# Patient Record
Sex: Female | Born: 1990
Health system: Southern US, Community
[De-identification: ages and names within clinical notes are randomized; demographics above are authoritative.]

## PROBLEM LIST (undated history)

## (undated) ENCOUNTER — Inpatient Hospital Stay (HOSPITAL_COMMUNITY): Payer: Self-pay

## (undated) DIAGNOSIS — R011 Cardiac murmur, unspecified: Secondary | ICD-10-CM

## (undated) HISTORY — DX: Cardiac murmur, unspecified: R01.1

---
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
## 2009-02-20 ENCOUNTER — Emergency Department (HOSPITAL_COMMUNITY): Admission: EM | Admit: 2009-02-20 | Discharge: 2009-02-20 | Payer: Self-pay | Admitting: Emergency Medicine

## 2010-01-02 ENCOUNTER — Other Ambulatory Visit
Admission: RE | Admit: 2010-01-02 | Discharge: 2010-01-02 | Payer: Self-pay | Source: Home / Self Care | Admitting: Obstetrics and Gynecology

## 2010-04-01 ENCOUNTER — Emergency Department (HOSPITAL_COMMUNITY)
Admission: EM | Admit: 2010-04-01 | Discharge: 2010-04-01 | Disposition: A | Discharge status: Home / Self Care | Payer: BC Managed Care – PPO | Attending: Emergency Medicine | Admitting: Emergency Medicine

## 2010-04-01 DIAGNOSIS — J209 Acute bronchitis, unspecified: Secondary | ICD-10-CM | POA: Insufficient documentation

## 2010-04-01 DIAGNOSIS — R0602 Shortness of breath: Secondary | ICD-10-CM | POA: Insufficient documentation

## 2010-04-04 LAB — PREGNANCY, URINE: Preg Test, Ur: NEGATIVE

## 2013-01-13 NOTE — L&D Delivery Note (Signed)
Delivery Note Patient pushed for 45 minutes and at 10:53 AM a viable and healthy female was delivered via Vaginal, Spontaneous Delivery (Presentation: Left Occiput Anterior).  APGAR: 9, 9; weight .  Placenta status: Intact, Spontaneous.  Cord: 3 vessels with the following complications: None.   Anesthesia: Epidural  Episiotomy: none Lacerations: 2nd degree; left Sulcus Suture Repair: 2.0 3.0 chromic vicryl Est. Blood Loss (mL): 450  Mom to postpartum.  Baby to Couplet care / Skin to Skin.  Essie HartINN, Nevaan Bunton [name] 08/27/2013, 12:02 PM

## 2013-03-04 LAB — OB RESULTS CONSOLE ABO/RH: RH Type: POSITIVE

## 2013-03-04 LAB — OB RESULTS CONSOLE HIV ANTIBODY (ROUTINE TESTING): HIV: NONREACTIVE

## 2013-03-04 LAB — OB RESULTS CONSOLE HEPATITIS B SURFACE ANTIGEN: Hepatitis B Surface Ag: NEGATIVE

## 2013-03-04 LAB — OB RESULTS CONSOLE GC/CHLAMYDIA
CHLAMYDIA, DNA PROBE: NEGATIVE
GC PROBE AMP, GENITAL: NEGATIVE

## 2013-03-04 LAB — OB RESULTS CONSOLE ANTIBODY SCREEN: Antibody Screen: NEGATIVE

## 2013-03-04 LAB — OB RESULTS CONSOLE RUBELLA ANTIBODY, IGM: Rubella: NON-IMMUNE/NOT IMMUNE

## 2013-03-04 LAB — OB RESULTS CONSOLE RPR: RPR: NONREACTIVE

## 2013-04-26 ENCOUNTER — Inpatient Hospital Stay (HOSPITAL_COMMUNITY): Payer: BC Managed Care – PPO

## 2013-04-26 ENCOUNTER — Inpatient Hospital Stay (HOSPITAL_COMMUNITY)
Admission: AD | Admit: 2013-04-26 | Discharge: 2013-04-26 | Disposition: A | Discharge status: Home / Self Care | Payer: BC Managed Care – PPO | Source: Ambulatory Visit | Attending: Obstetrics and Gynecology | Admitting: Obstetrics and Gynecology

## 2013-04-26 ENCOUNTER — Encounter (HOSPITAL_COMMUNITY): Payer: Self-pay

## 2013-04-26 DIAGNOSIS — O4690 Antepartum hemorrhage, unspecified, unspecified trimester: Secondary | ICD-10-CM

## 2013-04-26 DIAGNOSIS — O209 Hemorrhage in early pregnancy, unspecified: Secondary | ICD-10-CM | POA: Insufficient documentation

## 2013-04-26 DIAGNOSIS — R109 Unspecified abdominal pain: Secondary | ICD-10-CM | POA: Insufficient documentation

## 2013-04-26 LAB — URINALYSIS, ROUTINE W REFLEX MICROSCOPIC
BILIRUBIN URINE: NEGATIVE
GLUCOSE, UA: NEGATIVE mg/dL
HGB URINE DIPSTICK: NEGATIVE
KETONES UR: NEGATIVE mg/dL
NITRITE: NEGATIVE
PROTEIN: NEGATIVE mg/dL
Specific Gravity, Urine: 1.02 (ref 1.005–1.030)
UROBILINOGEN UA: 0.2 mg/dL (ref 0.0–1.0)
pH: 7 (ref 5.0–8.0)

## 2013-04-26 LAB — WET PREP, GENITAL
Trich, Wet Prep: NONE SEEN
Yeast Wet Prep HPF POC: NONE SEEN

## 2013-04-26 LAB — URINE MICROSCOPIC-ADD ON

## 2013-04-26 LAB — ABO/RH: ABO/RH(D): A POS

## 2013-04-26 NOTE — MAU Provider Note (Signed)
History     CSN: [id number]  Arrival date and time: 04/26/13 1404   None     Chief Complaint  Patient presents with  . Vaginal Bleeding  . Abdominal Cramping   HPI  Ms. [name] is a 23 y.o. female G1P0 at 6039w6d who presents with vaginal bleeding that started yesterday, along with abdominal cramping. No recent intercourse. Yesterday the bleeding was red, today it is more pink. She rates the abdominal cramping 3-4. Denies bleeding any other time in this pregnancy. No history of placenta previa that she knows of.   OB History   Grav Para Term Preterm Abortions TAB SAB Ect Mult Living   1               History reviewed. No pertinent past medical history.  Past Surgical History  Procedure Laterality Date  . Wisdom tooth extraction  2009    History reviewed. No pertinent family history.  History  Substance Use Topics  . Smoking status: Never Smoker   . Smokeless tobacco: Current User  . Alcohol Use: Yes     Comment: occasional in the past. Not with the pregnancy    Allergies: No Known Allergies  Prescriptions prior to admission  Medication Sig Dispense Refill  . Prenatal Vit-Fe Fumarate-FA (PRENATAL MULTIVITAMIN) TABS tablet Take 1 tablet by mouth at bedtime.      . simethicone (MYLICON) 125 MG chewable tablet Chew 125 mg by mouth every 6 (six) hours as needed for flatulence.       Results for orders placed during the hospital encounter of 04/26/13 (from the past 48 hour(s))  URINALYSIS, ROUTINE W REFLEX MICROSCOPIC     Status: Abnormal   Collection Time    04/26/13  2:25 PM      Result Value Ref Range   Color, Urine YELLOW  YELLOW   APPearance HAZY (*) CLEAR   Specific Gravity, Urine 1.020  1.005 - 1.030   pH 7.0  5.0 - 8.0   Glucose, UA NEGATIVE  NEGATIVE mg/dL   Hgb urine dipstick NEGATIVE  NEGATIVE   Bilirubin Urine NEGATIVE  NEGATIVE   Ketones, ur NEGATIVE  NEGATIVE mg/dL   Protein, ur NEGATIVE  NEGATIVE mg/dL   Urobilinogen, UA 0.2  0.0 - 1.0  mg/dL   Nitrite NEGATIVE  NEGATIVE   Leukocytes, UA SMALL (*) NEGATIVE  URINE MICROSCOPIC-ADD ON     Status: Abnormal   Collection Time    04/26/13  2:25 PM      Result Value Ref Range   Squamous Epithelial / LPF MANY (*) RARE   WBC, UA 3-6  <3 WBC/hpf   RBC / HPF 0-2  <3 RBC/hpf   Bacteria, UA FEW (*) RARE   Urine-Other AMORPHOUS URATES/PHOSPHATES    ABO/RH     Status: None   Collection Time    04/26/13  3:00 PM      Result Value Ref Range   ABO/RH(D) A POS    WET PREP, GENITAL     Status: Abnormal   Collection Time    04/26/13  3:18 PM      Result Value Ref Range   Yeast Wet Prep HPF POC NONE SEEN  NONE SEEN   Trich, Wet Prep NONE SEEN  NONE SEEN   Clue Cells Wet Prep HPF POC FEW (*) NONE SEEN   WBC, Wet Prep HPF POC FEW (*) NONE SEEN   Comment: MODERATE BACTERIA SEEN    Review of Systems  Constitutional: Negative for  fever and chills.  Gastrointestinal: Positive for abdominal pain. Negative for nausea, vomiting, diarrhea and constipation.  Genitourinary: Negative for dysuria, urgency, frequency and hematuria.       No vaginal discharge. + vaginal bleeding. No dysuria.    Physical Exam   Blood pressure 118/71, pulse 88, temperature 98.8 F (37.1 C), temperature source Oral, resp. rate 16, height 5\' 5"  (1.651 m), weight 58.151 kg (128 lb 3.2 oz), SpO2 100.00%.  Physical Exam  Constitutional: She is oriented to person, place, and time. She appears well-developed and well-nourished. No distress.  HENT:  Head: Normocephalic.  Eyes: Pupils are equal, round, and reactive to light.  Neck: Neck supple.  Respiratory: Effort normal.  GI: Soft. She exhibits no distension. There is no tenderness.  Genitourinary: Vaginal discharge found.  Speculum exam: Vagina - Small amount of creamy, bubbly, brown discharge, no odor Cervix - No contact bleeding, no active bleeding  Bimanual exam: Cervix closed Uterus non tender Adnexa non tender, no masses bilaterally Wet prep  done Chaperone present for exam.   Musculoskeletal: Normal range of motion.  Neurological: She is alert and oriented to person, place, and time.  Skin: Skin is warm. She is not diaphoretic.  Psychiatric: Her behavior is normal.    MAU Course  Procedures None  MDM +fht 134 bpm  Consulted with Dr. Dareen PianoAnderson; discussed US findings with him. Ok to discharge patient home  Report shows AFI within normal limits, Placenta anterior, above cervical os, cervical length 3.81 cm, no evidence of previa.  A positive blood type Patient has good fetal movement at the time of discharge.   Assessment and Plan   A:  Vaginal bleeding in second trimester; unknown etiology   P:  Discharge home in stable condition  Pelvic rest Bleeding precautions discussed Return to MAU if symptoms worsen Keep your appointment in the office on Friday.  Iona HansenJennifer Irene Yasaman Kolek, NP  04/26/2013, 5:32 PM

## 2013-04-26 NOTE — Discharge Instructions (Signed)
Vaginal Bleeding During Pregnancy, Second Trimester A small amount of bleeding (spotting) from the vagina is relatively common in pregnancy. It usually stops on its own. Various things can cause bleeding or spotting in pregnancy. Some bleeding may be related to the pregnancy, and some may not. Sometimes the bleeding is normal and is not a problem. However, bleeding can also be a sign of something serious. Be sure to tell your health care provider about any vaginal bleeding right away. Some possible causes of vaginal bleeding during the second trimester include:  Infection, inflammation, or growths on the cervix.   The placenta may be partially or completely covering the opening of the cervix inside the uterus (placenta previa).  The placenta may have separated from the uterus (abruption of the placenta).   You may be having early (preterm) labor.   The cervix may not be strong enough to keep a baby inside the uterus (cervical insufficiency).   Tiny cysts may have developed in the uterus instead of pregnancy tissue (molar pregnancy). HOME CARE INSTRUCTIONS  Watch your condition for any changes. The following actions may help to lessen any discomfort you are feeling:  Follow your health care provider's instructions for limiting your activity. If your health care provider orders bed rest, you may need to stay in bed and only get up to use the bathroom. However, your health care provider may allow you to continue light activity.  If needed, make plans for someone to help with your regular activities and responsibilities while you are on bed rest.  Keep track of the number of pads you use each day, how often you change pads, and how soaked (saturated) they are. Write this down.  Do not use tampons. Do not douche.  Do not have sexual intercourse or orgasms until approved by your health care provider.  If you pass any tissue from your vagina, save the tissue so you can show it to your  health care provider.  Only take over-the-counter or prescription medicines as directed by your health care provider.  Do not take aspirin because it can make you bleed.  Do not exercise or perform any strenuous activities or heavy lifting without your health care provider's permission.  Keep all follow-up appointments as directed by your health care provider. SEEK MEDICAL CARE IF:  You have any vaginal bleeding during any part of your pregnancy.  You have cramps or labor pains. SEEK IMMEDIATE MEDICAL CARE IF:   You have severe cramps in your back or belly (abdomen).  You have contractions.  You have a fever, not controlled by medicine.  You have chills.  You pass large clots or tissue from your vagina.  Your bleeding increases.  You feel lightheaded or weak, or you have fainting episodes.  You are leaking fluid or have a gush of fluid from your vagina. MAKE SURE YOU:  Understand these instructions.  Will watch your condition.  Will get help right away if you are not doing well or get worse. Document Released: 10/09/2004 Document Revised: 10/20/2012 Document Reviewed: 09/06/2012 [facility] Patient Information 2014 [place].  Pelvic Rest Pelvic rest is sometimes recommended for women when:   The placenta is partially or completely covering the opening of the cervix (placenta previa).  There is bleeding between the uterine wall and the amniotic sac in the first trimester (subchorionic hemorrhage).  The cervix begins to open without labor starting (incompetent cervix, cervical insufficiency).  The labor is too early (preterm labor). HOME CARE INSTRUCTIONS  Do not have sexual intercourse, stimulation, or an orgasm.  Do not use tampons, douche, or put anything in the vagina.  Do not lift anything over 10 pounds (4.5 kg).  Avoid strenuous activity or straining your pelvic muscles. SEEK MEDICAL CARE IF:  You have any vaginal bleeding during pregnancy.  Treat this as a potential emergency.  You have cramping pain felt low in the stomach (stronger than menstrual cramps).  You notice vaginal discharge (watery, mucus, or bloody).  You have a low, dull backache.  There are regular contractions or uterine tightening. SEEK IMMEDIATE MEDICAL CARE IF: You have vaginal bleeding and have placenta previa.  Document Released: 04/26/2010 Document Revised: 03/24/2011 Document Reviewed: 04/26/2010 [facility] Patient Information 2014 [place], [place].  Vaginal Bleeding During Pregnancy, Second Trimester A small amount of bleeding (spotting) from the vagina is relatively common in pregnancy. It usually stops on its own. Various things can cause bleeding or spotting in pregnancy. Some bleeding may be related to the pregnancy, and some may not. Sometimes the bleeding is normal and is not a problem. However, bleeding can also be a sign of something serious. Be sure to tell your health care provider about any vaginal bleeding right away. Some possible causes of vaginal bleeding during the second trimester include:  Infection, inflammation, or growths on the cervix.   The placenta may be partially or completely covering the opening of the cervix inside the uterus (placenta previa).  The placenta may have separated from the uterus (abruption of the placenta).   You may be having early (preterm) labor.   The cervix may not be strong enough to keep a baby inside the uterus (cervical insufficiency).   Tiny cysts may have developed in the uterus instead of pregnancy tissue (molar pregnancy). HOME CARE INSTRUCTIONS  Watch your condition for any changes. The following actions may help to lessen any discomfort you are feeling:  Follow your health care provider's instructions for limiting your activity. If your health care provider orders bed rest, you may need to stay in bed and only get up to use the bathroom. However, your health care provider may allow you  to continue light activity.  If needed, make plans for someone to help with your regular activities and responsibilities while you are on bed rest.  Keep track of the number of pads you use each day, how often you change pads, and how soaked (saturated) they are. Write this down.  Do not use tampons. Do not douche.  Do not have sexual intercourse or orgasms until approved by your health care provider.  If you pass any tissue from your vagina, save the tissue so you can show it to your health care provider.  Only take over-the-counter or prescription medicines as directed by your health care provider.  Do not take aspirin because it can make you bleed.  Do not exercise or perform any strenuous activities or heavy lifting without your health care provider's permission.  Keep all follow-up appointments as directed by your health care provider. SEEK MEDICAL CARE IF:  You have any vaginal bleeding during any part of your pregnancy.  You have cramps or labor pains. SEEK IMMEDIATE MEDICAL CARE IF:   You have severe cramps in your back or belly (abdomen).  You have contractions.  You have a fever, not controlled by medicine.  You have chills.  You pass large clots or tissue from your vagina.  Your bleeding increases.  You feel lightheaded or weak, or you have fainting  episodes.  You are leaking fluid or have a gush of fluid from your vagina. MAKE SURE YOU:  Understand these instructions.  Will watch your condition.  Will get help right away if you are not doing well or get worse. Document Released: 10/09/2004 Document Revised: 10/20/2012 Document Reviewed: 09/06/2012 [facility] Patient Information 2014 [place], [place].

## 2013-04-26 NOTE — MAU Note (Signed)
Patient states she started spotting last night with red color. Today had a little more spotting that was pink. Started having abdominal cramping today. States she has been feeling fetal movement.. Denies nausea or vomiting.

## 2013-08-01 LAB — OB RESULTS CONSOLE GBS: GBS: NEGATIVE

## 2013-08-26 ENCOUNTER — Inpatient Hospital Stay (HOSPITAL_COMMUNITY)
Admission: AD | Admit: 2013-08-26 | Discharge: 2013-08-29 | DRG: 775 | Disposition: A | Discharge status: Home / Self Care | Payer: BC Managed Care – PPO | Source: Ambulatory Visit | Attending: Obstetrics & Gynecology | Admitting: Obstetrics & Gynecology

## 2013-08-26 ENCOUNTER — Encounter (HOSPITAL_COMMUNITY): Payer: Self-pay | Admitting: *Deleted

## 2013-08-26 ENCOUNTER — Telehealth (HOSPITAL_COMMUNITY): Payer: Self-pay | Admitting: *Deleted

## 2013-08-26 DIAGNOSIS — O99334 Smoking (tobacco) complicating childbirth: Secondary | ICD-10-CM | POA: Diagnosis present

## 2013-08-26 NOTE — Telephone Encounter (Signed)
Preadmission screen  

## 2013-08-26 NOTE — MAU Note (Signed)
Contractions all day. Stronger and closer last 2 hrs. Was 2cm and completely thinned out this morning. Denies leaking fld and some bloody mucous

## 2013-08-27 ENCOUNTER — Encounter (HOSPITAL_COMMUNITY): Payer: Self-pay | Admitting: *Deleted

## 2013-08-27 ENCOUNTER — Inpatient Hospital Stay (HOSPITAL_COMMUNITY): Payer: BC Managed Care – PPO | Admitting: Anesthesiology

## 2013-08-27 ENCOUNTER — Encounter (HOSPITAL_COMMUNITY): Payer: BC Managed Care – PPO | Admitting: Anesthesiology

## 2013-08-27 DIAGNOSIS — O99334 Smoking (tobacco) complicating childbirth: Secondary | ICD-10-CM | POA: Diagnosis present

## 2013-08-27 DIAGNOSIS — O479 False labor, unspecified: Secondary | ICD-10-CM | POA: Diagnosis present

## 2013-08-27 LAB — CBC
HEMATOCRIT: 42.6 % (ref 36.0–46.0)
Hemoglobin: 15.2 g/dL — ABNORMAL HIGH (ref 12.0–15.0)
MCH: 35 pg — ABNORMAL HIGH (ref 26.0–34.0)
MCHC: 35.7 g/dL (ref 30.0–36.0)
MCV: 98.2 fL (ref 78.0–100.0)
PLATELETS: 224 10*3/uL (ref 150–400)
RBC: 4.34 MIL/uL (ref 3.87–5.11)
RDW: 12.9 % (ref 11.5–15.5)
WBC: 17.8 10*3/uL — AB (ref 4.0–10.5)

## 2013-08-27 LAB — RPR

## 2013-08-27 MED ORDER — PHENYLEPHRINE 40 MCG/ML (10ML) SYRINGE FOR IV PUSH (FOR BLOOD PRESSURE SUPPORT)
PREFILLED_SYRINGE | INTRAVENOUS | Status: AC
  Administered 2013-08-27: 80 ug via INTRAVENOUS
  Filled 2013-08-27: qty 10

## 2013-08-27 MED ORDER — OXYCODONE-ACETAMINOPHEN 5-325 MG PO TABS
1.0000 | ORAL_TABLET | ORAL | Status: DC | PRN
  Administered 2013-08-27 – 2013-08-29 (×9): 2 via ORAL
  Filled 2013-08-27 (×9): qty 2

## 2013-08-27 MED ORDER — LIDOCAINE HCL (PF) 1 % IJ SOLN
INTRAMUSCULAR | Status: DC | PRN
  Administered 2013-08-27 (×2): 5 mL

## 2013-08-27 MED ORDER — OXYTOCIN 40 UNITS IN LACTATED RINGERS INFUSION - SIMPLE MED
62.5000 mL/h | INTRAVENOUS | Status: DC | PRN
Start: 2013-08-27 — End: 2013-08-29

## 2013-08-27 MED ORDER — LACTATED RINGERS IV SOLN
500.0000 mL | Freq: Once | INTRAVENOUS | Status: AC
  Administered 2013-08-27: 500 mL via INTRAVENOUS

## 2013-08-27 MED ORDER — OXYCODONE-ACETAMINOPHEN 5-325 MG PO TABS: 1.0000 | ORAL_TABLET | ORAL | Status: DC | PRN

## 2013-08-27 MED ORDER — LANOLIN HYDROUS EX OINT
TOPICAL_OINTMENT | CUTANEOUS | Status: DC | PRN
Start: 2013-08-27 — End: 2013-08-29

## 2013-08-27 MED ORDER — ONDANSETRON HCL 4 MG/2ML IJ SOLN: 4.0000 mg | INTRAMUSCULAR | Status: DC | PRN

## 2013-08-27 MED ORDER — OXYTOCIN 40 UNITS IN LACTATED RINGERS INFUSION - SIMPLE MED
62.5000 mL/h | INTRAVENOUS | Status: DC
  Administered 2013-08-27: 500 mL/h via INTRAVENOUS

## 2013-08-27 MED ORDER — IBUPROFEN 600 MG PO TABS
600.0000 mg | ORAL_TABLET | Freq: Four times a day (QID) | ORAL | Status: DC
Start: 2013-08-27 — End: 2013-08-29
  Administered 2013-08-27 – 2013-08-29 (×9): 600 mg via ORAL
  Filled 2013-08-27 (×8): qty 1

## 2013-08-27 MED ORDER — PHENYLEPHRINE 40 MCG/ML (10ML) SYRINGE FOR IV PUSH (FOR BLOOD PRESSURE SUPPORT)
80.0000 ug | PREFILLED_SYRINGE | INTRAVENOUS | Status: DC | PRN
  Administered 2013-08-27 (×2): 80 ug via INTRAVENOUS
  Filled 2013-08-27: qty 2

## 2013-08-27 MED ORDER — SIMETHICONE 80 MG PO CHEW: 80.0000 mg | CHEWABLE_TABLET | ORAL | Status: DC | PRN

## 2013-08-27 MED ORDER — ONDANSETRON HCL 4 MG/2ML IJ SOLN
4.0000 mg | Freq: Four times a day (QID) | INTRAMUSCULAR | Status: DC | PRN
  Administered 2013-08-27 (×2): 4 mg via INTRAVENOUS
  Filled 2013-08-27 (×2): qty 2

## 2013-08-27 MED ORDER — IBUPROFEN 600 MG PO TABS
600.0000 mg | ORAL_TABLET | Freq: Four times a day (QID) | ORAL | Status: DC | PRN
  Filled 2013-08-27: qty 1

## 2013-08-27 MED ORDER — SENNOSIDES-DOCUSATE SODIUM 8.6-50 MG PO TABS
2.0000 | ORAL_TABLET | ORAL | Status: DC
Start: 2013-08-28 — End: 2013-08-29
  Administered 2013-08-28 (×2): 2 via ORAL
  Filled 2013-08-27 (×2): qty 2

## 2013-08-27 MED ORDER — BUTORPHANOL TARTRATE 1 MG/ML IJ SOLN
1.0000 mg | INTRAMUSCULAR | Status: DC | PRN
  Administered 2013-08-27 (×2): 1 mg via INTRAVENOUS
  Filled 2013-08-27 (×2): qty 1

## 2013-08-27 MED ORDER — PRENATAL MULTIVITAMIN CH
1.0000 | ORAL_TABLET | Freq: Every day | ORAL | Status: DC
  Administered 2013-08-27 – 2013-08-29 (×3): 1 via ORAL
  Filled 2013-08-27 (×3): qty 1

## 2013-08-27 MED ORDER — DIBUCAINE 1 % RE OINT
1.0000 | TOPICAL_OINTMENT | RECTAL | Status: DC | PRN
Start: 2013-08-27 — End: 2013-08-29
  Administered 2013-08-28: 1 via RECTAL
  Filled 2013-08-27 (×2): qty 28

## 2013-08-27 MED ORDER — ACETAMINOPHEN 325 MG PO TABS: 650.0000 mg | ORAL_TABLET | ORAL | Status: DC | PRN

## 2013-08-27 MED ORDER — LACTATED RINGERS IV SOLN
INTRAVENOUS | Status: DC
  Administered 2013-08-27: 125 mL/h via INTRAVENOUS
  Administered 2013-08-27: 04:00:00 via INTRAVENOUS

## 2013-08-27 MED ORDER — TETANUS-DIPHTH-ACELL PERTUSSIS 5-2.5-18.5 LF-MCG/0.5 IM SUSP
0.5000 mL | Freq: Once | INTRAMUSCULAR | Status: AC
  Administered 2013-08-29: 0.5 mL via INTRAMUSCULAR

## 2013-08-27 MED ORDER — CITRIC ACID-SODIUM CITRATE 334-500 MG/5ML PO SOLN: 30.0000 mL | ORAL | Status: DC | PRN

## 2013-08-27 MED ORDER — FENTANYL 2.5 MCG/ML BUPIVACAINE 1/10 % EPIDURAL INFUSION (WH - ANES)
INTRAMUSCULAR | Status: AC
  Filled 2013-08-27: qty 125

## 2013-08-27 MED ORDER — ZOLPIDEM TARTRATE 5 MG PO TABS: 5.0000 mg | ORAL_TABLET | Freq: Every evening | ORAL | Status: DC | PRN

## 2013-08-27 MED ORDER — EPHEDRINE 5 MG/ML INJ
10.0000 mg | INTRAVENOUS | Status: DC | PRN
  Filled 2013-08-27: qty 2

## 2013-08-27 MED ORDER — ONDANSETRON HCL 4 MG PO TABS: 4.0000 mg | ORAL_TABLET | ORAL | Status: DC | PRN

## 2013-08-27 MED ORDER — OXYTOCIN BOLUS FROM INFUSION: 500.0000 mL | INTRAVENOUS | Status: DC

## 2013-08-27 MED ORDER — TERBUTALINE SULFATE 1 MG/ML IJ SOLN: 0.2500 mg | Freq: Once | INTRAMUSCULAR | Status: AC | PRN

## 2013-08-27 MED ORDER — BENZOCAINE-MENTHOL 20-0.5 % EX AERO
1.0000 | INHALATION_SPRAY | CUTANEOUS | Status: DC | PRN
Start: 2013-08-27 — End: 2013-08-29
  Administered 2013-08-27 – 2013-08-28 (×2): 1 via TOPICAL
  Filled 2013-08-27 (×3): qty 56

## 2013-08-27 MED ORDER — DIPHENHYDRAMINE HCL 25 MG PO CAPS
25.0000 mg | ORAL_CAPSULE | Freq: Four times a day (QID) | ORAL | Status: DC | PRN
Start: 2013-08-27 — End: 2013-08-29

## 2013-08-27 MED ORDER — OXYTOCIN 40 UNITS IN LACTATED RINGERS INFUSION - SIMPLE MED
1.0000 m[IU]/min | INTRAVENOUS | Status: DC
  Administered 2013-08-27: 2 m[IU]/min via INTRAVENOUS
  Filled 2013-08-27: qty 1000

## 2013-08-27 MED ORDER — WITCH HAZEL-GLYCERIN EX PADS
1.0000 | MEDICATED_PAD | CUTANEOUS | Status: DC | PRN
Start: 2013-08-27 — End: 2013-08-29
  Administered 2013-08-28: 1 via TOPICAL

## 2013-08-27 MED ORDER — FLEET ENEMA 7-19 GM/118ML RE ENEM: 1.0000 | ENEMA | RECTAL | Status: DC | PRN

## 2013-08-27 MED ORDER — LIDOCAINE HCL (PF) 1 % IJ SOLN
30.0000 mL | INTRAMUSCULAR | Status: DC | PRN
  Filled 2013-08-27: qty 30

## 2013-08-27 MED ORDER — PHENYLEPHRINE 40 MCG/ML (10ML) SYRINGE FOR IV PUSH (FOR BLOOD PRESSURE SUPPORT)
80.0000 ug | PREFILLED_SYRINGE | INTRAVENOUS | Status: DC | PRN
  Administered 2013-08-27: 80 ug via INTRAVENOUS
  Filled 2013-08-27: qty 2

## 2013-08-27 MED ORDER — FENTANYL 2.5 MCG/ML BUPIVACAINE 1/10 % EPIDURAL INFUSION (WH - ANES)
14.0000 mL/h | INTRAMUSCULAR | Status: DC | PRN
  Administered 2013-08-27: 14 mL/h via EPIDURAL

## 2013-08-27 MED ORDER — DIPHENHYDRAMINE HCL 50 MG/ML IJ SOLN: 12.5000 mg | INTRAMUSCULAR | Status: DC | PRN

## 2013-08-27 MED ORDER — LACTATED RINGERS IV SOLN
500.0000 mL | INTRAVENOUS | Status: DC | PRN
  Administered 2013-08-27: 500 mL via INTRAVENOUS

## 2013-08-27 NOTE — Progress Notes (Signed)
Benard RinkMary Anselmi is a 23 y.o. G1P0 at 3369w3d by LMP admitted for active labor  Subjective: Patient comfortable with epidural   Objective: BP 98/55  Pulse 72  Temp(Src) 98.3 F (36.8 C) (Oral)  Resp 16  Ht 5\' 5"  (1.651 m)  Wt 71.215 kg (157 lb)  BMI 26.13 kg/m2  SpO2 97%      FHT:  FHR: 125 bpm, variability: moderate,  accelerations:  Present,  decelerations:  Absent UC:   regular, every 6-10 minutes SVE:   Dilation: 8.5 Effacement (%): 100 Station: -1;-2 Exam by:: Dr.Aizza Santiago  Labs: Lab Results  Component Value Date   WBC 17.8* 08/27/2013   HGB 15.2* 08/27/2013   HCT 42.6 08/27/2013   MCV 98.2 08/27/2013   PLT 224 08/27/2013    Assessment / Plan: Protracted active phase Will start pitocin   Labor: will start augmentation Preeclampsia:  no signs or symptoms of toxicity Fetal Wellbeing:  Category I Pain Control:  Epidural I/D:  n/a Anticipated MOD:  NSVD  Nyjah Schwake [name] 08/27/2013, 7:44 AM

## 2013-08-27 NOTE — Anesthesia Postprocedure Evaluation (Signed)
  Anesthesia Post-op Note  Patient: Sophia Reynolds  Procedure(s) Performed: * No procedures listed *  Patient Location: Mother/Baby  Anesthesia Type:Epidural  Level of Consciousness: awake and alert   Airway and Oxygen Therapy: Patient Spontanous Breathing  Post-op Pain: mild  Post-op Assessment: Post-op Vital signs reviewed, No signs of Nausea or vomiting, Pain level controlled, No headache, No residual numbness and No residual motor weakness  Post-op Vital Signs: Reviewed  Last Vitals:  Filed Vitals:   08/27/13 1510  BP: 121/63  Pulse: 98  Temp: 37.2 C  Resp: 18    Complications: No apparent anesthesia complications

## 2013-08-27 NOTE — Anesthesia Procedure Notes (Signed)
Epidural Patient location during procedure: OB Start time: 08/27/2013 3:50 AM  Staffing Anesthesiologist: Brayton CavesJACKSON, Daryan Cagley Performed by: anesthesiologist   Preanesthetic Checklist Completed: patient identified, site marked, surgical consent, pre-op evaluation, timeout performed, IV checked, risks and benefits discussed and monitors and equipment checked  Epidural Patient position: sitting Prep: site prepped and draped and DuraPrep Patient monitoring: continuous pulse ox and blood pressure Approach: midline Location: L3-L4 Injection technique: LOR air  Needle:  Needle type: Tuohy  Needle gauge: 17 G Needle length: 9 cm and 9 Needle insertion depth: 5 cm cm Catheter type: closed end flexible Catheter size: 19 Gauge Catheter at skin depth: 10 cm Test dose: negative  Assessment Events: blood not aspirated, injection not painful, no injection resistance, negative IV test and no paresthesia  Additional Notes Patient identified.  Risk benefits discussed including failed block, incomplete pain control, headache, nerve damage, paralysis, blood pressure changes, nausea, vomiting, reactions to medication both toxic or allergic, and postpartum back pain.  Patient expressed understanding and wished to proceed.  All questions were answered.  Sterile technique used throughout procedure and epidural site dressed with sterile barrier dressing. No paresthesia or other complications noted.The patient did not experience any signs of intravascular injection such as tinnitus or metallic taste in mouth nor signs of intrathecal spread such as rapid motor block. Please see nursing notes for vital signs.

## 2013-08-27 NOTE — H&P (Signed)
Sophia Reynolds is a 23 y.o. female presenting regular painful contractions with bloody show.  Maternal Medical History:  Reason for admission: Contractions and vaginal bleeding.   Contractions: Onset was 6-12 hours ago.   Frequency: regular.   Duration is approximately 60 seconds.   Perceived severity is strong.    Fetal activity: Perceived fetal activity is normal.   Last perceived fetal movement was within the past 12 hours.    Prenatal complications: no prenatal complications Prenatal Complications - Diabetes: none.    OB History   Grav Para Term Preterm Abortions TAB SAB Ect Mult Living   1              Past Medical History  Diagnosis Date  . Heart murmur     benign   Past Surgical History  Procedure Laterality Date  . Wisdom tooth extraction  2009   Family History: family history is not on file. Social History:  reports that she has never smoked. She uses smokeless tobacco. She reports that she drinks alcohol. She reports that she does not use illicit drugs.   Prenatal Transfer Tool  Maternal Diabetes: No Genetic Screening: Normal Maternal Ultrasounds/Referrals: Normal Fetal Ultrasounds or other Referrals:  None Maternal Substance Abuse:  No Significant Maternal Medications:  None Significant Maternal Lab Results:  Lab values include: Group B Strep negative Other Comments:  None  Review of Systems  Constitutional: Negative.   HENT: Negative.  Negative for hearing loss.   Eyes: Negative.  Negative for blurred vision.  Respiratory: Negative for cough.   Cardiovascular: Negative for chest pain.  Gastrointestinal: Negative.  Negative for heartburn.  Genitourinary: Negative for dysuria.  Musculoskeletal: Negative for myalgias.  Skin: Negative.   Neurological: Negative.  Negative for dizziness and headaches.  Endo/Heme/Allergies: Negative.   Psychiatric/Behavioral: Negative for depression.    Dilation: 8.5 Effacement (%): 100 Station: -1;-2 Exam by::  Dr.Treyvone Chelf Blood pressure 98/55, pulse 72, temperature 98.3 F (36.8 C), temperature source Oral, resp. rate 16, height 5\' 5"  (1.651 m), weight 71.215 kg (157 lb), SpO2 97.00%. Maternal Exam:  Uterine Assessment: Contraction strength is moderate.  Contraction duration is 60 seconds. Contraction frequency is regular.   Abdomen: Patient reports no abdominal tenderness. Fundal height is 40cm.   Estimated fetal weight is 380 grams.   Fetal presentation: vertex  Introitus: Normal vulva. Normal vagina.  Ferning test: not done.  Nitrazine test: not done. Amniotic fluid character: not assessed.  Pelvis: adequate for delivery.   Cervix: Cervix evaluated by digital exam.   On admission 4/90-100/-2 bloody show  Fetal Exam Fetal Monitor Review: Mode: hand-held doppler probe.   Baseline rate: 125.  Variability: moderate (6-25 bpm).   Pattern: accelerations present and no decelerations.    Fetal State Assessment: Category I - tracings are normal.     Physical Exam  Nursing note and vitals reviewed. Constitutional: She is oriented to person, place, and time. She appears well-developed and well-nourished.  HENT:  Head: Normocephalic.  Eyes: Pupils are equal, round, and reactive to light.  Neck: Normal range of motion.  Cardiovascular: Normal rate and regular rhythm.   Respiratory: Effort normal.  GI: Soft.  Genitourinary: Vagina normal.  Musculoskeletal: Normal range of motion.  Neurological: She is alert and oriented to person, place, and time.    Prenatal labs: ABO, Rh: --/--/A POS (04/14 1500) Antibody: Negative (02/20 0000) Rubella: Nonimmune (02/20 0000) RPR: NON REAC (08/15 0025)  HBsAg: Negative (02/20 0000)  HIV: Non-reactive (02/20 0000)  GBS: Negative (  07/20 0000)   Assessment/Plan: 23 yo G1P0 SIUP at 40 weeks 3 days in early active labor Epidural on demand Continuous monitoring IV hydration  Anticipate NSVD   [name] [name] 08/27/2013, 7:36 AM

## 2013-08-27 NOTE — Progress Notes (Signed)
Called to evaluate the patient's perineum for possible vulvar hematoma.  Exam: Mild swollen vulva bilaterally with ecchymosis present on Left Labia majora,  No severe hematoma Plan: Watch for expansion Ice packs Patient reassured.   Sophia Reynolds [name]

## 2013-08-27 NOTE — Anesthesia Preprocedure Evaluation (Signed)

## 2013-08-28 LAB — CBC
HEMATOCRIT: 29.4 % — AB (ref 36.0–46.0)
Hemoglobin: 10 g/dL — ABNORMAL LOW (ref 12.0–15.0)
MCH: 34.5 pg — ABNORMAL HIGH (ref 26.0–34.0)
MCHC: 34 g/dL (ref 30.0–36.0)
MCV: 101.4 fL — ABNORMAL HIGH (ref 78.0–100.0)
Platelets: 185 10*3/uL (ref 150–400)
RBC: 2.9 MIL/uL — ABNORMAL LOW (ref 3.87–5.11)
RDW: 12.9 % (ref 11.5–15.5)
WBC: 14.8 10*3/uL — AB (ref 4.0–10.5)

## 2013-08-28 MED ORDER — HYDROMORPHONE HCL 2 MG PO TABS
1.0000 mg | ORAL_TABLET | ORAL | Status: DC | PRN
  Administered 2013-08-28: 2 mg via ORAL
  Filled 2013-08-28: qty 1

## 2013-08-28 NOTE — Progress Notes (Signed)
Post Partum Day 1 Subjective: up ad lib, voiding, tolerating PO, + flatus and Patient concerned about her perineum and notes rectal pain  Objective: Blood pressure 128/73, pulse 76, temperature 98.4 F (36.9 C), temperature source Oral, resp. rate 18, height 5\' 5"  (1.651 m), weight 71.215 kg (157 lb), SpO2 97.00%, unknown if currently breastfeeding.  Physical Exam:  General: alert, cooperative and no distress Lochia: appropriate Uterine Fundus: firm Perineum / rectum:  Vulvar hematoma resolving, Right labia majora swollen, not abnormal  Repair intact, no breakdown or dehiscence, normal healing DVT Evaluation: No evidence of DVT seen on physical exam. Negative Homan's sign. No cords or calf tenderness.   Recent Labs  08/27/13 0025 08/28/13 0630  HGB 15.2* 10.0*  HCT 42.6 29.4*    Assessment/Plan: Plan for discharge tomorrow and Breastfeeding Patient reassured about healing of perineum  I will see her in 4 weeks   LOS: 2 days   Davontae Prusinski [name] 08/28/2013, 11:20 AM

## 2013-08-29 MED ORDER — OXYCODONE-ACETAMINOPHEN 5-325 MG PO TABS: 1.0000 | ORAL_TABLET | ORAL | Status: DC | PRN

## 2013-08-29 MED ORDER — MEASLES, MUMPS & RUBELLA VAC ~~LOC~~ INJ
0.5000 mL | INJECTION | Freq: Once | SUBCUTANEOUS | Status: AC
  Administered 2013-08-29: 0.5 mL via SUBCUTANEOUS
  Filled 2013-08-29 (×2): qty 0.5

## 2013-08-29 NOTE — Discharge Summary (Signed)
Obstetric Discharge Summary Reason for Admission: onset of labor Prenatal Procedures: ultrasound Intrapartum Procedures: spontaneous vaginal delivery Postpartum Procedures: vulvar hematoma. Complications-Operative and Postpartum: 2 degree perineal laceration Hemoglobin  Date Value Ref Range Status  08/28/2013 10.0* 12.0 - 15.0 g/dL Final     REPEATED TO VERIFY     DELTA CHECK NOTED     HCT  Date Value Ref Range Status  08/28/2013 29.4* 36.0 - 46.0 % Final    Physical Exam:  General: alert Lochia: appropriate Uterine Fundus: firm  Discharge Diagnoses: Term Pregnancy-delivered  Discharge Information: Date: 08/29/2013 Activity: pelvic rest Diet: routine Medications: PNV and Percocet Condition: stable Instructions: refer to practice specific booklet Discharge to: home Follow-up Information   [facility], Sanjuana MaeWALDA [name], MD. Schedule an appointment as soon as possible for a visit in 1 month.   Specialty:  Obstetrics and Gynecology   Contact information:   [address] [place] [address] [phone]       Newborn Data: Live born female  Birth Weight: 7 lb 10.8 oz (3480 g) APGAR: 9, 9  Home with mother.  Tatjana Turcott E 08/29/2013, 8:43 AM

## 2013-08-29 NOTE — Progress Notes (Signed)
PPD#2 Pt doing well PP. Would like to go home. VSSAF IMP/ Stable Plan/ Will discharge.

## 2013-08-31 ENCOUNTER — Inpatient Hospital Stay (HOSPITAL_COMMUNITY): Admission: RE | Admit: 2013-08-31 | Payer: BC Managed Care – PPO | Source: Ambulatory Visit

## 2013-10-05 ENCOUNTER — Encounter (HOSPITAL_COMMUNITY): Payer: Self-pay | Admitting: Emergency Medicine

## 2013-10-05 ENCOUNTER — Emergency Department (INDEPENDENT_AMBULATORY_CARE_PROVIDER_SITE_OTHER)
Admission: EM | Admit: 2013-10-05 | Discharge: 2013-10-05 | Disposition: A | Discharge status: Home / Self Care | Payer: BC Managed Care – PPO | Source: Home / Self Care | Attending: Family Medicine | Admitting: Family Medicine

## 2013-10-05 DIAGNOSIS — R0789 Other chest pain: Secondary | ICD-10-CM

## 2013-10-05 DIAGNOSIS — R1011 Right upper quadrant pain: Secondary | ICD-10-CM

## 2013-10-05 LAB — COMPREHENSIVE METABOLIC PANEL
ALBUMIN: 4.4 g/dL (ref 3.5–5.2)
ALK PHOS: 178 U/L — AB (ref 39–117)
ALT: 142 U/L — ABNORMAL HIGH (ref 0–35)
ANION GAP: 13 (ref 5–15)
AST: 333 U/L — AB (ref 0–37)
BUN: 7 mg/dL (ref 6–23)
CO2: 27 mEq/L (ref 19–32)
Calcium: 9.3 mg/dL (ref 8.4–10.5)
Chloride: 101 mEq/L (ref 96–112)
Creatinine, Ser: 0.92 mg/dL (ref 0.50–1.10)
GFR calc Af Amer: >90 mL/min (ref >90)
GFR calc non Af Amer: 86 mL/min — ABNORMAL LOW (ref >90)
Glucose, Bld: 107 mg/dL — ABNORMAL HIGH (ref 70–99)
Potassium: 4 mEq/L (ref 3.7–5.3)
Sodium: 141 mEq/L (ref 137–147)
Total Bilirubin: 2.1 mg/dL — ABNORMAL HIGH (ref 0.3–1.2)
Total Protein: 7.4 g/dL (ref 6.0–8.3)

## 2013-10-05 LAB — CBC WITH DIFFERENTIAL/PLATELET
BASOS ABS: 0 10*3/uL (ref 0.0–0.1)
BASOS PCT: 0 % (ref 0–1)
EOS ABS: 0 10*3/uL (ref 0.0–0.7)
EOS PCT: 0 % (ref 0–5)
HEMATOCRIT: 42.9 % (ref 36.0–46.0)
Hemoglobin: 14.7 g/dL (ref 12.0–15.0)
Lymphocytes Relative: 10 % — ABNORMAL LOW (ref 12–46)
Lymphs Abs: 0.7 10*3/uL (ref 0.7–4.0)
MCH: 32.3 pg (ref 26.0–34.0)
MCHC: 34.3 g/dL (ref 30.0–36.0)
MCV: 94.3 fL (ref 78.0–100.0)
MONO ABS: 0.5 10*3/uL (ref 0.1–1.0)
Monocytes Relative: 7 % (ref 3–12)
Neutro Abs: 6.2 10*3/uL (ref 1.7–7.7)
Neutrophils Relative %: 83 % — ABNORMAL HIGH (ref 43–77)
Platelets: 231 10*3/uL (ref 150–400)
RBC: 4.55 MIL/uL (ref 3.87–5.11)
RDW: 12.2 % (ref 11.5–15.5)
WBC: 7.4 10*3/uL (ref 4.0–10.5)

## 2013-10-05 NOTE — ED Provider Notes (Signed)
CSN: [id number]     Arrival date & time 10/05/13  0844 History   First MD Initiated Contact with Patient 10/05/13 0901     Chief Complaint  Patient presents with  . Chest Pain   (Consider location/radiation/quality/duration/timing/severity/associated sxs/prior Treatment)   HPI  R sided chest pain: started about 4 wks ago. Gave birth to daughter 5.5 wks ago. Starts in RUQ and radiates to middle of chest and R shoulder blade. Comes on after meals. Occurs about 1-2x wkly. Lasts for about 2 hrs. Last episod started last night and still currently in pain. Dull ache. Denies, no change in stools, fevers, bruising, severe HA. Denies any pregnancy complications including HELLP, HTN, other. Zantac w/o benefit. Has woken up at night in pain. Prilosec 20-40mg  w/o benefit. Non-reproducible on inspiration or palpitations. Denies palpitations, syncope, SOB.   Currently w/ center of chest to epigastric pain.   Past Medical History  Diagnosis Date  . Heart murmur     benign   Past Surgical History  Procedure Laterality Date  . Wisdom tooth extraction  2009   Family History  Problem Relation Age of Onset  . Coronary artery disease Other    History  Substance Use Topics  . Smoking status: Never Smoker   . Smokeless tobacco: Never Used  . Alcohol Use: Yes     Comment: occasional in the past. Not with the pregnancy   OB History   Grav Para Term Preterm Abortions TAB SAB Ect Mult Living   Review of Systems Per HPI with all other pertinent systems negative.   Allergies  Review of patient's allergies indicates no known allergies.  Home Medications   Prior to Admission medications   Medication Sig Start Date End Date Taking? Authorizing Provider  oxyCODONE-acetaminophen (PERCOCET/ROXICET) 5-325 MG per tablet Take 1-2 tablets by mouth every 4 (four) hours as needed for severe pain. 08/29/13  Yes Levi Aland, MD  ranitidine (ZANTAC) 150 MG tablet Take 150 mg by mouth 2  (two) times daily.   Yes Historical Provider, MD  Prenatal Vit-Fe Fumarate-FA (PRENATAL MULTIVITAMIN) TABS tablet Take 1 tablet by mouth daily.     Historical Provider, MD   BP 114/78  Pulse 85  Temp(Src) 97.5 F (36.4 C) (Oral)  SpO2 99%  Breastfeeding? Yes Physical Exam  Constitutional: She is oriented to person, place, and time. She appears well-developed and well-nourished. No distress.  HENT:  Head: Normocephalic and atraumatic.  Eyes: EOM are normal. Pupils are equal, round, and reactive to light.  Neck: Normal range of motion.  Cardiovascular: Normal rate, normal heart sounds and intact distal pulses.   No murmur heard. Pulmonary/Chest: Effort normal and breath sounds normal. No respiratory distress.  Abdominal: Soft. Bowel sounds are normal. She exhibits no distension and no mass. There is no tenderness. There is no rebound and no guarding.  Neg Murphy';s sign and no pain at McBurny's point  Musculoskeletal: Normal range of motion. She exhibits no edema and no tenderness.  Neurological: She is alert and oriented to person, place, and time. No cranial nerve deficit.  Skin: Skin is warm and dry. She is not diaphoretic.  Psychiatric: She has a normal mood and affect. Her behavior is normal. Judgment and thought content normal.    ED Course  Procedures (including critical care time) Labs Review Labs Reviewed  COMPREHENSIVE METABOLIC PANEL  CBC WITH DIFFERENTIAL    Imaging Review No results  found.   MDM   1. Right upper quadrant pain   2. Chest pain, non-cardiac    RUQ pain concerning for cholelithiasis.  Tried 2 wks of 20-40mg  omeprazole w/o benefit so unlikely GERD but still a posibilitis - start Nexium which pt already has at home Need to evaluate biliary system and heme labs - CMET, CBC. Pt needs [place] to evaluate stones - going to PCP today and will defer for them to f/u as currently neg for Murphy's sign. If unable to order pt to call back here and I will order.   Unlikely MSK pain.  Precautions given and all questions answered  Shelly Flatten, MD Family Medicine 10/05/2013, 9:54 AM       Ozella Rocks, MD 10/05/13 [phone]

## 2013-10-05 NOTE — Discharge Instructions (Signed)
Your pain is likely related to your gall bladder Your pain is not due to your heart Please follow up with your PCP today. If they are unable to order an ultrasound let us know and I will try to get one arranged We will let you know if any of your other blood work comes back abnormal Best of luck and congratulations Consider trying the Nexium

## 2013-10-05 NOTE — ED Notes (Signed)
Pt c/o right sided chest pain just below her ribs.  Started after giving birth to daughter 5 weeks ago.  Pt states it leaves her short of breath.

## 2013-10-07 ENCOUNTER — Encounter (HOSPITAL_COMMUNITY): Payer: Self-pay | Admitting: Pharmacy Technician

## 2013-10-07 ENCOUNTER — Other Ambulatory Visit (INDEPENDENT_AMBULATORY_CARE_PROVIDER_SITE_OTHER): Payer: Self-pay | Admitting: General Surgery

## 2013-10-11 ENCOUNTER — Encounter (HOSPITAL_COMMUNITY): Payer: Self-pay

## 2013-10-11 ENCOUNTER — Encounter (HOSPITAL_COMMUNITY)
Admission: RE | Admit: 2013-10-11 | Discharge: 2013-10-11 | Disposition: A | Discharge status: Home / Self Care | Payer: BC Managed Care – PPO | Source: Ambulatory Visit | Attending: General Surgery | Admitting: General Surgery

## 2013-10-11 DIAGNOSIS — R1011 Right upper quadrant pain: Secondary | ICD-10-CM | POA: Diagnosis present

## 2013-10-11 DIAGNOSIS — R011 Cardiac murmur, unspecified: Secondary | ICD-10-CM | POA: Diagnosis not present

## 2013-10-11 DIAGNOSIS — K219 Gastro-esophageal reflux disease without esophagitis: Secondary | ICD-10-CM | POA: Diagnosis not present

## 2013-10-11 DIAGNOSIS — K8020 Calculus of gallbladder without cholecystitis without obstruction: Secondary | ICD-10-CM | POA: Diagnosis not present

## 2013-10-11 DIAGNOSIS — K649 Unspecified hemorrhoids: Secondary | ICD-10-CM | POA: Diagnosis not present

## 2013-10-11 LAB — COMPREHENSIVE METABOLIC PANEL
ALT: 45 U/L — ABNORMAL HIGH (ref 0–35)
ANION GAP: 13 (ref 5–15)
AST: 25 U/L (ref 0–37)
Albumin: 4 g/dL (ref 3.5–5.2)
Alkaline Phosphatase: 101 U/L (ref 39–117)
BUN: 10 mg/dL (ref 6–23)
CO2: 25 mEq/L (ref 19–32)
CREATININE: 0.85 mg/dL (ref 0.50–1.10)
Calcium: 9 mg/dL (ref 8.4–10.5)
Chloride: 101 mEq/L (ref 96–112)
GFR calc Af Amer: >90 mL/min (ref >90)
GFR calc non Af Amer: >90 mL/min (ref >90)
Glucose, Bld: 98 mg/dL (ref 70–99)
Potassium: 4 mEq/L (ref 3.7–5.3)
Sodium: 139 mEq/L (ref 137–147)
TOTAL PROTEIN: 6.8 g/dL (ref 6.0–8.3)
Total Bilirubin: 0.6 mg/dL (ref 0.3–1.2)

## 2013-10-11 LAB — HCG, SERUM, QUALITATIVE: Preg, Serum: NEGATIVE

## 2013-10-11 MED ORDER — CHLORHEXIDINE GLUCONATE 4 % EX LIQD: 1.0000 "application " | Freq: Once | CUTANEOUS | Status: DC

## 2013-10-11 MED ORDER — CEFAZOLIN SODIUM-DEXTROSE 2-3 GM-% IV SOLR
2.0000 g | INTRAVENOUS | Status: AC
  Administered 2013-10-12: 2 g via INTRAVENOUS
  Filled 2013-10-11: qty 50

## 2013-10-11 NOTE — Pre-Procedure Instructions (Signed)
Benard RinkMary Dobratz  10/11/2013   Your procedure is scheduled on:  September 30.  [facility] at 8:00 AM.  Call this number if you have problems the morning of surgery: [phone]   Remember:   Do not eat food or drink liquids after midnight.   Take these medicines the morning of surgery with A SIP OF WATER: None.   Do not wear jewelry, make-up or nail polish.  Do not wear lotions, powders, or perfumes.   Do not shave 48 hours prior to surgery.   Do not bring valuables to the hospital.              [facility] not [facility] any belongings or valuables.               Contacts, dentures or bridgework may not be worn into surgery.  Leave suitcase in the car. After surgery it may be brought to your room.  For patients admitted to the hospital, discharge time is determined by your treatment team.               Patients discharged the day of surgery will not be allowed to drive home.  Name and phone number of your driver: -    Special Instructions: -   Please read over the following fact sheets that you were given: Pain Booklet, Coughing and Deep Breathing and Surgical Site Infection Prevention

## 2013-10-11 NOTE — Progress Notes (Signed)
[facility],no futher tests required.

## 2013-10-12 ENCOUNTER — Encounter (HOSPITAL_COMMUNITY)
Admission: RE | Disposition: A | Discharge status: Home / Self Care | Payer: Self-pay | Source: Ambulatory Visit | Attending: General Surgery

## 2013-10-12 ENCOUNTER — Encounter (HOSPITAL_COMMUNITY): Payer: BC Managed Care – PPO | Admitting: Anesthesiology

## 2013-10-12 ENCOUNTER — Encounter (HOSPITAL_COMMUNITY): Payer: Self-pay | Admitting: Anesthesiology

## 2013-10-12 ENCOUNTER — Ambulatory Visit (HOSPITAL_COMMUNITY)
Admission: RE | Admit: 2013-10-12 | Discharge: 2013-10-12 | Disposition: A | Discharge status: Home / Self Care | Payer: BC Managed Care – PPO | Source: Ambulatory Visit | Attending: General Surgery | Admitting: General Surgery

## 2013-10-12 ENCOUNTER — Ambulatory Visit (HOSPITAL_COMMUNITY): Payer: BC Managed Care – PPO

## 2013-10-12 ENCOUNTER — Ambulatory Visit (HOSPITAL_COMMUNITY): Payer: BC Managed Care – PPO | Admitting: Anesthesiology

## 2013-10-12 DIAGNOSIS — K219 Gastro-esophageal reflux disease without esophagitis: Secondary | ICD-10-CM | POA: Insufficient documentation

## 2013-10-12 DIAGNOSIS — K649 Unspecified hemorrhoids: Secondary | ICD-10-CM | POA: Insufficient documentation

## 2013-10-12 DIAGNOSIS — K8020 Calculus of gallbladder without cholecystitis without obstruction: Secondary | ICD-10-CM

## 2013-10-12 DIAGNOSIS — R011 Cardiac murmur, unspecified: Secondary | ICD-10-CM | POA: Insufficient documentation

## 2013-10-12 HISTORY — PX: CHOLECYSTECTOMY: SHX55

## 2013-10-12 SURGERY — LAPAROSCOPIC CHOLECYSTECTOMY WITH INTRAOPERATIVE CHOLANGIOGRAM
Anesthesia: General | Site: Abdomen

## 2013-10-12 MED ORDER — KETOROLAC TROMETHAMINE 15 MG/ML IJ SOLN
15.0000 mg | Freq: Once | INTRAMUSCULAR | Status: AC
  Administered 2013-10-12: 15 mg via INTRAVENOUS

## 2013-10-12 MED ORDER — PROPOFOL 10 MG/ML IV BOLUS
INTRAVENOUS | Status: AC
  Filled 2013-10-12: qty 20

## 2013-10-12 MED ORDER — LIDOCAINE HCL (CARDIAC) 20 MG/ML IV SOLN
INTRAVENOUS | Status: DC | PRN
  Administered 2013-10-12: 40 mg via INTRAVENOUS

## 2013-10-12 MED ORDER — OXYCODONE HCL 5 MG PO TABS
5.0000 mg | ORAL_TABLET | Freq: Once | ORAL | Status: AC | PRN
  Administered 2013-10-12: 5 mg via ORAL

## 2013-10-12 MED ORDER — OXYCODONE HCL 5 MG PO TABS
ORAL_TABLET | ORAL | Status: AC
  Filled 2013-10-12: qty 1

## 2013-10-12 MED ORDER — HYDROMORPHONE HCL 1 MG/ML IJ SOLN
INTRAMUSCULAR | Status: AC
  Filled 2013-10-12: qty 1

## 2013-10-12 MED ORDER — DEXAMETHASONE SODIUM PHOSPHATE 4 MG/ML IJ SOLN
INTRAMUSCULAR | Status: AC
  Filled 2013-10-12: qty 1

## 2013-10-12 MED ORDER — HYDROMORPHONE HCL 1 MG/ML IJ SOLN
0.2500 mg | INTRAMUSCULAR | Status: DC | PRN
  Administered 2013-10-12 (×2): 0.5 mg via INTRAVENOUS

## 2013-10-12 MED ORDER — PROPOFOL 10 MG/ML IV BOLUS
INTRAVENOUS | Status: DC | PRN
  Administered 2013-10-12: 130 mg via INTRAVENOUS

## 2013-10-12 MED ORDER — PROMETHAZINE HCL 25 MG/ML IJ SOLN
INTRAMUSCULAR | Status: AC
  Filled 2013-10-12: qty 1

## 2013-10-12 MED ORDER — FENTANYL CITRATE 0.05 MG/ML IJ SOLN
INTRAMUSCULAR | Status: DC | PRN
  Administered 2013-10-12 (×2): 50 ug via INTRAVENOUS
  Administered 2013-10-12: 100 ug via INTRAVENOUS
  Administered 2013-10-12: 50 ug via INTRAVENOUS

## 2013-10-12 MED ORDER — OXYCODONE HCL 5 MG/5ML PO SOLN: 5.0000 mg | Freq: Once | ORAL | Status: AC | PRN

## 2013-10-12 MED ORDER — KETOROLAC TROMETHAMINE 15 MG/ML IJ SOLN
INTRAMUSCULAR | Status: AC
  Filled 2013-10-12: qty 1

## 2013-10-12 MED ORDER — IOHEXOL 300 MG/ML  SOLN
INTRAMUSCULAR | Status: DC | PRN
  Administered 2013-10-12: 11:00:00

## 2013-10-12 MED ORDER — STERILE WATER FOR INJECTION IJ SOLN
INTRAMUSCULAR | Status: AC
  Filled 2013-10-12: qty 10

## 2013-10-12 MED ORDER — NEOSTIGMINE METHYLSULFATE 10 MG/10ML IV SOLN
INTRAVENOUS | Status: AC
  Filled 2013-10-12: qty 1

## 2013-10-12 MED ORDER — 0.9 % SODIUM CHLORIDE (POUR BTL) OPTIME
TOPICAL | Status: DC | PRN
  Administered 2013-10-12: 1000 mL

## 2013-10-12 MED ORDER — GLYCOPYRROLATE 0.2 MG/ML IJ SOLN
INTRAMUSCULAR | Status: DC | PRN
  Administered 2013-10-12: 0.6 mg via INTRAVENOUS

## 2013-10-12 MED ORDER — EPHEDRINE SULFATE 50 MG/ML IJ SOLN
INTRAMUSCULAR | Status: AC
  Filled 2013-10-12: qty 1

## 2013-10-12 MED ORDER — MIDAZOLAM HCL 5 MG/5ML IJ SOLN
INTRAMUSCULAR | Status: DC | PRN
  Administered 2013-10-12: 2 mg via INTRAVENOUS

## 2013-10-12 MED ORDER — FENTANYL CITRATE 0.05 MG/ML IJ SOLN
INTRAMUSCULAR | Status: AC
  Filled 2013-10-12: qty 5

## 2013-10-12 MED ORDER — LACTATED RINGERS IV SOLN
INTRAVENOUS | Status: DC
  Administered 2013-10-12: 09:00:00 via INTRAVENOUS

## 2013-10-12 MED ORDER — ARTIFICIAL TEARS OP OINT
TOPICAL_OINTMENT | OPHTHALMIC | Status: AC
  Filled 2013-10-12: qty 3.5

## 2013-10-12 MED ORDER — BUPIVACAINE-EPINEPHRINE 0.25% -1:200000 IJ SOLN
INTRAMUSCULAR | Status: DC | PRN
  Administered 2013-10-12: 20 mL

## 2013-10-12 MED ORDER — SCOPOLAMINE 1 MG/3DAYS TD PT72
1.0000 | MEDICATED_PATCH | TRANSDERMAL | Status: DC
  Administered 2013-10-12: 1.5 mg via TRANSDERMAL

## 2013-10-12 MED ORDER — BUPIVACAINE-EPINEPHRINE (PF) 0.25% -1:200000 IJ SOLN
INTRAMUSCULAR | Status: AC
  Filled 2013-10-12: qty 30

## 2013-10-12 MED ORDER — OXYCODONE-ACETAMINOPHEN 5-325 MG PO TABS: 1.0000 | ORAL_TABLET | ORAL | Status: DC | PRN

## 2013-10-12 MED ORDER — LACTATED RINGERS IV SOLN
INTRAVENOUS | Status: DC | PRN
  Administered 2013-10-12 (×2): via INTRAVENOUS

## 2013-10-12 MED ORDER — SODIUM CHLORIDE 0.9 % IR SOLN
Status: DC | PRN
  Administered 2013-10-12: 1

## 2013-10-12 MED ORDER — SCOPOLAMINE 1 MG/3DAYS TD PT72
MEDICATED_PATCH | TRANSDERMAL | Status: AC
  Filled 2013-10-12: qty 1

## 2013-10-12 MED ORDER — ROCURONIUM BROMIDE 100 MG/10ML IV SOLN
INTRAVENOUS | Status: DC | PRN
  Administered 2013-10-12: 35 mg via INTRAVENOUS

## 2013-10-12 MED ORDER — ROCURONIUM BROMIDE 50 MG/5ML IV SOLN
INTRAVENOUS | Status: AC
  Filled 2013-10-12: qty 1

## 2013-10-12 MED ORDER — GLYCOPYRROLATE 0.2 MG/ML IJ SOLN
INTRAMUSCULAR | Status: AC
  Filled 2013-10-12: qty 3

## 2013-10-12 MED ORDER — LIDOCAINE HCL (CARDIAC) 20 MG/ML IV SOLN
INTRAVENOUS | Status: AC
  Filled 2013-10-12: qty 5

## 2013-10-12 MED ORDER — DEXAMETHASONE SODIUM PHOSPHATE 4 MG/ML IJ SOLN
INTRAMUSCULAR | Status: DC | PRN
  Administered 2013-10-12: 8 mg via INTRAVENOUS

## 2013-10-12 MED ORDER — PROMETHAZINE HCL 25 MG/ML IJ SOLN
6.2500 mg | INTRAMUSCULAR | Status: DC | PRN
  Administered 2013-10-12: 6.25 mg via INTRAVENOUS

## 2013-10-12 MED ORDER — NEOSTIGMINE METHYLSULFATE 10 MG/10ML IV SOLN
INTRAVENOUS | Status: DC | PRN
  Administered 2013-10-12: 4 mg via INTRAVENOUS

## 2013-10-12 MED ORDER — MIDAZOLAM HCL 2 MG/2ML IJ SOLN
INTRAMUSCULAR | Status: AC
  Filled 2013-10-12: qty 2

## 2013-10-12 MED ORDER — ONDANSETRON HCL 4 MG/2ML IJ SOLN
INTRAMUSCULAR | Status: AC
  Filled 2013-10-12: qty 2

## 2013-10-12 SURGICAL SUPPLY — 37 items
BAG SPEC RTRVL LRG 6X4 10 (ENDOMECHANICALS) ×1
CANISTER SUCTION 2500CC (MISCELLANEOUS) ×3 IMPLANT
CATH REDDICK CHOLANGI 4FR 50CM (CATHETERS) ×3 IMPLANT
CHLORAPREP W/TINT 26ML (MISCELLANEOUS) ×3 IMPLANT
COVER MAYO STAND STRL (DRAPES) ×3 IMPLANT
COVER SURGICAL LIGHT HANDLE (MISCELLANEOUS) ×3 IMPLANT
DERMABOND ADVANCED (GAUZE/BANDAGES/DRESSINGS) ×2
DERMABOND ADVANCED .7 DNX12 (GAUZE/BANDAGES/DRESSINGS) ×1 IMPLANT
DRAPE C-ARM 42X72 X-RAY (DRAPES) ×3 IMPLANT
ELECT REM PT RETURN 9FT ADLT (ELECTROSURGICAL) ×3
ELECTRODE REM PT RTRN 9FT ADLT (ELECTROSURGICAL) ×1 IMPLANT
GLOVE BIO SURGEON STRL SZ7.5 (GLOVE) ×3 IMPLANT
GLOVE BIOGEL PI IND STRL 7.0 (GLOVE) ×1 IMPLANT
GLOVE BIOGEL PI IND STRL 7.5 (GLOVE) ×2 IMPLANT
GLOVE BIOGEL PI INDICATOR 7.0 (GLOVE) ×2
GLOVE BIOGEL PI INDICATOR 7.5 (GLOVE) ×4
GLOVE ECLIPSE 7.5 STRL STRAW (GLOVE) ×3 IMPLANT
GLOVE SURG SS PI 7.0 STRL IVOR (GLOVE) ×3 IMPLANT
GOWN STRL REUS W/ TWL LRG LVL3 (GOWN DISPOSABLE) ×4 IMPLANT
GOWN STRL REUS W/TWL LRG LVL3 (GOWN DISPOSABLE) ×8
IV CATH 14GX2 1/4 (CATHETERS) ×3 IMPLANT
KIT BASIN OR (CUSTOM PROCEDURE TRAY) ×3 IMPLANT
KIT ROOM TURNOVER OR (KITS) ×3 IMPLANT
NS IRRIG 1000ML POUR BTL (IV SOLUTION) ×3 IMPLANT
PAD ARMBOARD 7.5X6 YLW CONV (MISCELLANEOUS) ×3 IMPLANT
POUCH SPECIMEN RETRIEVAL 10MM (ENDOMECHANICALS) ×3 IMPLANT
SCISSORS LAP 5X35 DISP (ENDOMECHANICALS) ×3 IMPLANT
SET IRRIG TUBING LAPAROSCOPIC (IRRIGATION / IRRIGATOR) ×3 IMPLANT
SLEEVE ENDOPATH XCEL 5M (ENDOMECHANICALS) ×3 IMPLANT
SPECIMEN JAR SMALL (MISCELLANEOUS) ×3 IMPLANT
SUT MNCRL AB 4-0 PS2 18 (SUTURE) ×3 IMPLANT
TOWEL OR 17X24 6PK STRL BLUE (TOWEL DISPOSABLE) ×3 IMPLANT
TOWEL OR 17X26 10 PK STRL BLUE (TOWEL DISPOSABLE) ×3 IMPLANT
TRAY LAPAROSCOPIC (CUSTOM PROCEDURE TRAY) ×3 IMPLANT
TROCAR XCEL BLUNT TIP 100MML (ENDOMECHANICALS) ×3 IMPLANT
TROCAR XCEL NON-BLD 11X100MML (ENDOMECHANICALS) IMPLANT
TROCAR XCEL NON-BLD 5MMX100MML (ENDOMECHANICALS) ×3 IMPLANT

## 2013-10-12 NOTE — Anesthesia Procedure Notes (Signed)
Procedure Name: Intubation Date/Time: 10/12/2013 11:07 AM Performed by: Brien MatesMAHONY, Astria Jordahl D Pre-anesthesia Checklist: Patient identified, Emergency Drugs available, Suction available, Patient being monitored and Timeout performed Patient Re-evaluated:Patient Re-evaluated prior to inductionOxygen Delivery Method: Circle system utilized Preoxygenation: Pre-oxygenation with 100% oxygen Intubation Type: IV induction Ventilation: Mask ventilation without difficulty Laryngoscope Size: Miller and 2 Grade View: Grade I Tube type: Oral Tube size: 7.0 mm Number of attempts: 1 Airway Equipment and Method: Stylet Placement Confirmation: ETT inserted through vocal cords under direct vision,  positive ETCO2 and breath sounds checked- equal and bilateral Secured at: 21 cm Tube secured with: Tape Dental Injury: Teeth and Oropharynx as per pre-operative assessment

## 2013-10-12 NOTE — Transfer of Care (Signed)
Immediate Anesthesia Transfer of Care Note  Patient: Sophia RinkMary Reynolds  Procedure(s) Performed: Procedure(s): LAPAROSCOPIC CHOLECYSTECTOMY WITH INTRAOPERATIVE CHOLANGIOGRAM (N/A)  Patient Location: PACU  Anesthesia Type:General  Level of Consciousness: sedated  Airway & Oxygen Therapy: Patient Spontanous Breathing  Post-op Assessment: Report given to PACU RN and Post -op Vital signs reviewed and stable  Post vital signs: Reviewed and stable  Complications: No apparent anesthesia complications

## 2013-10-12 NOTE — Anesthesia Preprocedure Evaluation (Addendum)
Anesthesia Evaluation  Patient identified by MRN, date of birth, ID band Patient awake    Reviewed: Allergy & Precautions, H&P , NPO status , Patient's Chart, lab work & pertinent test results  Airway Mallampati: II TM Distance: >3 FB Neck ROM: Full    Dental  (+) Teeth Intact, Dental Advisory Given   Pulmonary neg pulmonary ROS,  breath sounds clear to auscultation        Cardiovascular negative cardio ROS  Rhythm:Regular Rate:Normal     Neuro/Psych negative neurological ROS  negative psych ROS   GI/Hepatic negative GI ROS, Neg liver ROS,   Endo/Other  negative endocrine ROS  Renal/GU negative Renal ROS     Musculoskeletal negative musculoskeletal ROS (+)   Abdominal   Peds  Hematology negative hematology ROS (+)   Anesthesia Other Findings   Reproductive/Obstetrics                          Anesthesia Physical Anesthesia Plan  ASA: I  Anesthesia Plan: General   Post-op Pain Management:    Induction: Intravenous  Airway Management Planned: Oral ETT  Additional Equipment:   Intra-op Plan:   Post-operative Plan: Extubation in OR  Informed Consent: I have reviewed the patients History and Physical, chart, labs and discussed the procedure including the risks, benefits and alternatives for the proposed anesthesia with the patient or authorized representative who has indicated his/her understanding and acceptance.   Dental advisory given  Plan Discussed with: CRNA and Surgeon  Anesthesia Plan Comments:        Anesthesia Quick Evaluation  

## 2013-10-12 NOTE — Anesthesia Postprocedure Evaluation (Signed)
  Anesthesia Post-op Note  Patient: Sophia Reynolds  Procedure(s) Performed: Procedure(s): LAPAROSCOPIC CHOLECYSTECTOMY WITH INTRAOPERATIVE CHOLANGIOGRAM (N/A)  Patient Location: PACU  Anesthesia Type:General  Level of Consciousness: awake, alert  and oriented  Airway and Oxygen Therapy: Patient Spontanous Breathing  Post-op Pain: none  Post-op Assessment: Post-op Vital signs reviewed  Post-op Vital Signs: Reviewed  Last Vitals:  Filed Vitals:   10/12/13 1331  BP:   Pulse:   Temp: 36.7 C  Resp:     Complications: No apparent anesthesia complications

## 2013-10-12 NOTE — H&P (Signed)
Sophia Reynolds 10/07/2013 10:31 AM Location: [facility] Patient #: [id number] DOB: 1990-06-23 Married / Language: English / Race: White Female  History of Present Illness Sophia Reynolds(Sophia Reynolds S. Sophia Edouardoth MD; 10/07/2013 2:15 PM) Patient words: eval gallbladder.  The patient is a 23 year old female who presents with abdominal pain. we're asked to see the patient in consultation by Dr. Sherwood Reynolds to evaluate her for gallstones. The patient is a 23 year old white female who presents with epigastric and right upper quadrant pain for the last month. The pain has been occurring about twice a week and lasting for several hours. She has had significant nausea and vomiting with the pain. Her recent ultrasound showed stones in her gallbladder but no gallbladder wall thickening. Her liver functions were mildly elevated   Other Problems Sophia Reynolds(Sophia Reynolds, CMA; 10/07/2013 1:29 PM) Cholelithiasis Gastroesophageal Reflux Disease Heart murmur Hemorrhoids  Past Surgical History Sophia Reynolds(Sophia Reynolds, CMA; 10/07/2013 1:29 PM) Oral Surgery  Diagnostic Studies History Sophia Reynolds(Sophia Reynolds, CMA; 10/07/2013 1:29 PM) Colonoscopy never Mammogram never Pap Smear 1-5 years ago  Allergies Sophia Reynolds(Sophia Reynolds, CMA; 10/07/2013 10:33 AM) No Known Drug Allergies09/25/2015  Medication History Sophia Reynolds(Sophia Reynolds, CMA; 10/07/2013 10:33 AM) Multivitamins (Oral daily) Active.  Social History Sophia Reynolds(Sophia Reynolds, CMA; 10/07/2013 1:29 PM) Alcohol use Occasional alcohol use. Caffeine use Carbonated beverages, Coffee. No drug use Tobacco use Never smoker.  Family History Sophia Reynolds(Sophia Reynolds, CMA; 10/07/2013 1:29 PM) Alcohol Abuse Family Members In General. Arthritis Family Members In General. Diabetes Mellitus Family Members In General. Hypertension Family Members In General.  Pregnancy / Birth History Sophia Reynolds(Sophia Reynolds, CMA; 10/07/2013 1:29 PM) Age at menarche 11 years. Contraceptive History Oral contraceptives. Gravida 1 Maternal age 25-25 Para  1 Regular periods  Review of Systems Sophia Reynolds(Sophia Reynolds CMA; 10/07/2013 1:29 PM) General Present- Appetite Loss. Not Present- Chills, Fatigue, Fever, Night Sweats, Weight Gain and Weight Loss. Skin Not Present- Change in Wart/Mole, Dryness, Hives, Jaundice, New Lesions, Non-Healing Wounds, Rash and Ulcer. HEENT Not Present- Earache, Hearing Loss, Hoarseness, Nose Bleed, Oral Ulcers, Ringing in the Ears, Seasonal Allergies, Sinus Pain, Sore Throat, Visual Disturbances, Wears glasses/contact lenses and Yellow Eyes. Respiratory Not Present- Bloody sputum, Chronic Cough, Difficulty Breathing, Snoring and Wheezing. Breast Not Present- Breast Mass, Breast Pain, Nipple Discharge and Skin Changes. Cardiovascular Present- Chest Pain. Not Present- Difficulty Breathing Lying Down, Leg Cramps, Palpitations, Rapid Heart Rate, Shortness of Breath and Swelling of Extremities. Gastrointestinal Present- Abdominal Pain, Indigestion, Nausea and Vomiting. Not Present- Bloating, Bloody Stool, Change in Bowel Habits, Chronic diarrhea, Constipation, Difficulty Swallowing, Excessive gas, Gets full quickly at meals, Hemorrhoids and Rectal Pain. Female Genitourinary Not Present- Frequency, Nocturia, Painful Urination, Pelvic Pain and Urgency. Musculoskeletal Not Present- Back Pain, Joint Pain, Joint Stiffness, Muscle Pain, Muscle Weakness and Swelling of Extremities. Neurological Not Present- Decreased Memory, Fainting, Headaches, Numbness, Seizures, Tingling, Tremor, Trouble walking and Weakness. Psychiatric Not Present- Anxiety, Bipolar, Change in Sleep Pattern, Depression, Fearful and Frequent crying. Endocrine Not Present- Cold Intolerance, Excessive Hunger, Hair Changes, Heat Intolerance, Hot flashes and New Diabetes. Hematology Not Present- Easy Bruising, Excessive bleeding, Gland problems, HIV and Persistent Infections.   Vitals (Sophia Reynolds CMA; 10/07/2013 1:30 PM) 10/07/2013 1:29 PM Weight: 128 lb Height:  65in Body Surface Area: 1.63 m Body Mass Index: 21.3 kg/m Temp.: 98.95F(Temporal)  Pulse: 76 (Regular)  BP: 124/74 (Sitting, Left Arm, Standard)    Physical Exam Sophia Reynolds(Sophia Reynolds S. Sophia Edouardoth MD; 10/07/2013 2:15 PM) General Mental Status-Alert. General Appearance-Consistent with stated age. Hydration-Well hydrated. Voice-Normal.  Head and Neck Head-normocephalic, atraumatic  with no lesions or palpable masses. Trachea-midline. Thyroid Gland Characteristics - normal size and consistency.  Eye Eyeball - Bilateral-Extraocular movements intact. Sclera/Conjunctiva - Bilateral-No scleral icterus.  Chest and Lung Exam Chest and lung exam reveals -quiet, even and easy respiratory effort with no use of accessory muscles and on auscultation, normal breath sounds, no adventitious sounds and normal vocal resonance. Inspection Chest Wall - Normal. Back - normal.  Breast Breast - Left-Symmetric, Non Tender, No Biopsy scars, no Dimpling, No Inflammation, No Lumpectomy scars, No Mastectomy scars, No Peau d' Orange. Breast - Right-Symmetric, Non Tender, No Biopsy scars, no Dimpling, No Inflammation, No Lumpectomy scars, No Mastectomy scars, No Peau d' Orange. Breast Lump-No Palpable Breast Mass.  Cardiovascular Cardiovascular examination reveals -normal heart sounds, regular rate and rhythm with no murmurs and normal pedal pulses bilaterally.  Abdomen Inspection Inspection of the abdomen reveals - No Hernias. Skin - Scar - no surgical scars. Palpation/Percussion Palpation and Percussion of the abdomen reveal - Soft, Non Tender, No Rebound tenderness, No Rigidity (guarding) and No hepatosplenomegaly. Auscultation Auscultation of the abdomen reveals - Bowel sounds normal.  Neurologic Neurologic evaluation reveals -alert and oriented x 3 with no impairment of recent or remote memory. Mental Status-Normal.  Musculoskeletal Normal Exam - Left-Upper Extremity  Strength Normal and Lower Extremity Strength Normal. Normal Exam - Right-Upper Extremity Strength Normal and Lower Extremity Strength Normal.  Lymphatic Head & Neck  General Head & Neck Lymphatics: Bilateral - Description - Normal. Axillary  General Axillary Region: Bilateral - Description - Normal. Tenderness - Non Tender. Femoral & Inguinal  Generalized Femoral & Inguinal Lymphatics: Bilateral - Description - Normal. Tenderness - Non Tender.    Assessment & Plan Sophia Reynolds S. Sophia Edouard MD; 10/07/2013 2:07 PM) CHOLELITHIASIS WITHOUT CHOLECYSTITIS (574.20  K80.20) Impression: the patient is a 23 year old white female who appears to have symptomatic cholelithiasis. Because of the risk of further painful episodes and possible pancreatitis I think she would benefit from having her gallbladder removed. I have discussed with her in detail the risks and benefits of surgery to remove the gallbladder as well as some of the technical aspects and she understands and wishes to proceed     Signed by Caleen Essex, MD (10/07/2013 2:15 PM)

## 2013-10-12 NOTE — Interval H&P Note (Signed)
History and Physical Interval Note:  10/12/2013 9:44 AM  Sophia RinkMary Reynolds  has presented today for surgery, with the diagnosis of Gallstones  The various methods of treatment have been discussed with the patient and family. After consideration of risks, benefits and other options for treatment, the patient has consented to  Procedure(s): LAPAROSCOPIC CHOLECYSTECTOMY WITH INTRAOPERATIVE CHOLANGIOGRAM (N/A) as a surgical intervention .  The patient's history has been reviewed, patient examined, no change in status, stable for surgery.  I have reviewed the patient's chart and labs.  Questions were answered to the patient's satisfaction.     TOTH III,PAUL S

## 2013-10-12 NOTE — Op Note (Signed)
10/12/2013  12:08 PM  PATIENT:  Sophia Reynolds  23 y.o. female  PRE-OPERATIVE DIAGNOSIS:  Gallstones  POST-OPERATIVE DIAGNOSIS:  Gallstones  PROCEDURE:  Procedure(s): LAPAROSCOPIC CHOLECYSTECTOMY WITH INTRAOPERATIVE CHOLANGIOGRAM (N/A)  SURGEON:  Surgeon(s) and Role:    * Griselda MinerPaul Toth III, MD - Primary    * Manus RuddMatthew Tsuei, MD - Assisting  PHYSICIAN ASSISTANT:   ASSISTANTS: Dr. Corliss Skainssuei   ANESTHESIA:   general  EBL:  Total I/O In: 1500 [I.V.:1500] Out: -   BLOOD ADMINISTERED:none  DRAINS: none   LOCAL MEDICATIONS USED:  MARCAINE     SPECIMEN:  Source of Specimen:  gallbladder  DISPOSITION OF SPECIMEN:  PATHOLOGY  COUNTS:  YES  TOURNIQUET:  * No tourniquets in log *  DICTATION: .Dragon Dictation  Procedure: After informed consent was obtained the patient was brought to the operating room and placed in the supine position on the operating room table. After adequate induction of general anesthesia the patient's abdomen was prepped with ChloraPrep allowed to dry and draped in usual sterile manner. The area below the umbilicus was infiltrated with quarter percent  Marcaine. A small incision was made with a 15 blade knife. The incision was carried down through the subcutaneous tissue bluntly with a hemostat and [employer] retractors. The linea alba was identified. The linea alba was incised with a 15 blade knife and each side was grasped with Coker clamps. The preperitoneal space was then probed with a hemostat until the peritoneum was opened and access was gained to the abdominal cavity. A 0 Vicryl pursestring stitch was placed in the fascia surrounding the opening. A Hassan cannula was then placed through the opening and anchored in place with the previously placed Vicryl purse string stitch. The abdomen was insufflated with carbon dioxide without difficulty. A laparoscope [facility] in the right upper quadrant was inspected. Next the epigastric region was  infiltrated with % Marcaine. A small incision was made with a 15 blade knife. A 5 mm port was placed bluntly through this incision into the abdominal cavity under direct vision. Next 2 sites were chosen laterally on the right side of the abdomen for placement of 5 mm ports. Each of these areas was infiltrated with quarter percent Marcaine. Small stab incisions were made with a 15 blade knife. 5 mm ports were then placed bluntly through these incisions into the abdominal cavity under direct vision without difficulty. A blunt grasper was placed through the lateralmost 5 mm port and used to grasp the dome of the gallbladder and elevated anteriorly and superiorly. Another blunt grasper was placed through the other 5 mm port and used to retract the body and neck of the gallbladder. A dissector was placed through the epigastric port and using the electrocautery the peritoneal reflection at the gallbladder neck was opened. Blunt dissection was then carried out in this area until the gallbladder neck-cystic duct junction was readily identified and a good window was created. A single clip was placed on the gallbladder neck. A small  ductotomy was made just below the clip with laparoscopic scissors. A 14-gauge Angiocath was then placed through the anterior abdominal wall under direct vision. A Reddick cholangiogram catheter was then placed through the Angiocath and flushed. The catheter was then placed in the cystic duct and anchored in place with a clip. A cholangiogram was obtained that showed no filling defects good emptying into the duodenum an adequate length on the cystic duct. The anchoring clip and catheters were then removed from  the patient. 3 clips were placed proximally on the cystic duct and the duct was divided between the 2 sets of clips. Posterior to this the cystic artery was identified and again dissected bluntly in a circumferential manner until a good window  was created. 2 clips were placed proximally  and one distally on the artery and the artery was divided between the 2 sets of clips. Next a laparoscopic hook cautery device was used to separate the gallbladder from the liver bed. Prior to completely detaching the gallbladder from the liver bed the liver bed was inspected and several small bleeding points were coagulated with the electrocautery until the area was completely hemostatic. The gallbladder was then detached the rest of it from the liver bed without difficulty. A laparoscopic bag was inserted through the epigastric port. The gallbladder was placed within the bag and the bag was sealed. A laparoscope was then moved to the epigastric port. The gallbladder grasper was placed through the [facility] cannula and used to grasp the opening of the bag. The bag with the gallbladder was then removed with the [facility] cannula through the infraumbilical port without difficulty. The fascial defect was then closed with the previously placed Vicryl pursestring stitch as well as with another figure-of-eight 0 Vicryl stitch. The liver bed was inspected again and found to be hemostatic. The abdomen was irrigated with copious amounts of saline until the effluent was clear. The ports were then removed under direct vision without difficulty and were found to be hemostatic. The gas was allowed to escape. The skin incisions were all closed with interrupted 4-0 Monocryl subcuticular stitches. Dermabond dressings were applied. The patient tolerated the procedure well. At the end of the case all needle sponge and instrument counts were correct. The patient was then awakened and taken to recovery in stable condition  PLAN OF CARE: Discharge to home after PACU  PATIENT DISPOSITION:  PACU - hemodynamically stable.   Delay start of Pharmacological VTE agent (>24hrs) due to surgical blood loss or risk of bleeding: not applicable

## 2013-10-13 ENCOUNTER — Encounter (HOSPITAL_COMMUNITY): Payer: Self-pay | Admitting: General Surgery

## 2013-11-14 ENCOUNTER — Encounter (HOSPITAL_COMMUNITY): Payer: Self-pay | Admitting: General Surgery

## 2016-01-14 NOTE — L&D Delivery Note (Signed)
Delivery Note Patient pushed for approximately 30 minutes after she was noted to be C/C/+2.At 3:07 PM a viable and healthy female was delivered via Vaginal, Spontaneous Delivery (Presentation: ROA).  APGAR: 8, 9; weight  Pending. Baby laid on maternal abdomen, delayed cord clamping done.  Cord clamped and cut by Dad. Placenta spontaneously delivered intact, 3 vessels noted.  Uterine atony alleviated with IV pitocin and massage.  Second degree vaginal / perineal laceration and right labial laceration repaired in routine fashion with 2-0 Vicryl and 3-0 chromic  No complications  Anesthesia:  Epidural Episiotomy: None Lacerations: 2nd degree Suture Repair: 2.0 vicryl Est. Blood Loss (mL): 350  Mom to postpartum.  Baby to Couplet care / Skin to Skin.  Essie HartINN, Zilpha Mcandrew [name] 10/29/2016, 4:13 PM

## 2016-04-25 LAB — OB RESULTS CONSOLE HIV ANTIBODY (ROUTINE TESTING): HIV: NONREACTIVE

## 2016-04-25 LAB — OB RESULTS CONSOLE GC/CHLAMYDIA
CHLAMYDIA, DNA PROBE: NEGATIVE
GC PROBE AMP, GENITAL: NEGATIVE

## 2016-04-25 LAB — OB RESULTS CONSOLE ANTIBODY SCREEN: ANTIBODY SCREEN: NEGATIVE

## 2016-04-25 LAB — OB RESULTS CONSOLE ABO/RH: RH Type: POSITIVE

## 2016-04-25 LAB — OB RESULTS CONSOLE RPR: RPR: NONREACTIVE

## 2016-04-25 LAB — OB RESULTS CONSOLE HEPATITIS B SURFACE ANTIGEN: Hepatitis B Surface Ag: NEGATIVE

## 2016-04-25 LAB — OB RESULTS CONSOLE RUBELLA ANTIBODY, IGM: RUBELLA: IMMUNE

## 2016-10-01 LAB — OB RESULTS CONSOLE GBS: STREP GROUP B AG: NEGATIVE

## 2016-10-20 ENCOUNTER — Encounter (HOSPITAL_COMMUNITY): Payer: Self-pay | Admitting: *Deleted

## 2016-10-20 ENCOUNTER — Telehealth (HOSPITAL_COMMUNITY): Payer: Self-pay | Admitting: *Deleted

## 2016-10-20 ENCOUNTER — Other Ambulatory Visit: Payer: Self-pay | Admitting: Obstetrics & Gynecology

## 2016-10-21 ENCOUNTER — Encounter (HOSPITAL_COMMUNITY): Payer: Self-pay | Admitting: *Deleted

## 2016-10-21 NOTE — Telephone Encounter (Signed)
Preadmission screen  

## 2016-10-29 ENCOUNTER — Inpatient Hospital Stay (HOSPITAL_COMMUNITY): Payer: 59 | Admitting: Anesthesiology

## 2016-10-29 ENCOUNTER — Encounter (HOSPITAL_COMMUNITY): Payer: Self-pay

## 2016-10-29 ENCOUNTER — Inpatient Hospital Stay (HOSPITAL_COMMUNITY): Admission: AD | Admit: 2016-10-29 | Payer: 59 | Source: Ambulatory Visit | Admitting: Obstetrics & Gynecology

## 2016-10-29 ENCOUNTER — Inpatient Hospital Stay (HOSPITAL_COMMUNITY)
Admission: RE | Admit: 2016-10-29 | Discharge: 2016-10-30 | DRG: 807 | Disposition: A | Discharge status: Home / Self Care | Payer: 59 | Source: Ambulatory Visit | Attending: Obstetrics & Gynecology | Admitting: Obstetrics & Gynecology

## 2016-10-29 DIAGNOSIS — Z3A39 39 weeks gestation of pregnancy: Secondary | ICD-10-CM | POA: Diagnosis not present

## 2016-10-29 DIAGNOSIS — O26893 Other specified pregnancy related conditions, third trimester: Secondary | ICD-10-CM | POA: Diagnosis present

## 2016-10-29 LAB — CBC
HCT: 36.5 % (ref 36.0–46.0)
HEMOGLOBIN: 12.3 g/dL (ref 12.0–15.0)
MCH: 31.7 pg (ref 26.0–34.0)
MCHC: 33.7 g/dL (ref 30.0–36.0)
MCV: 94.1 fL (ref 78.0–100.0)
Platelets: 214 10*3/uL (ref 150–400)
RBC: 3.88 MIL/uL (ref 3.87–5.11)
RDW: 13.4 % (ref 11.5–15.5)
WBC: 16.7 10*3/uL — ABNORMAL HIGH (ref 4.0–10.5)

## 2016-10-29 LAB — TYPE AND SCREEN
ABO/RH(D): A POS
Antibody Screen: NEGATIVE

## 2016-10-29 LAB — SYPHILIS: RPR W/REFLEX TO RPR TITER AND TREPONEMAL ANTIBODIES, TRADITIONAL SCREENING AND DIAGNOSIS ALGORITHM: RPR Ser Ql: NONREACTIVE

## 2016-10-29 MED ORDER — OXYTOCIN BOLUS FROM INFUSION
500.0000 mL | Freq: Once | INTRAVENOUS | Status: AC
  Administered 2016-10-29: 500 mL via INTRAVENOUS

## 2016-10-29 MED ORDER — OXYCODONE-ACETAMINOPHEN 5-325 MG PO TABS: 2.0000 | ORAL_TABLET | ORAL | Status: DC | PRN

## 2016-10-29 MED ORDER — ACETAMINOPHEN 325 MG PO TABS: 650.0000 mg | ORAL_TABLET | ORAL | Status: DC | PRN

## 2016-10-29 MED ORDER — PHENYLEPHRINE 40 MCG/ML (10ML) SYRINGE FOR IV PUSH (FOR BLOOD PRESSURE SUPPORT)
80.0000 ug | PREFILLED_SYRINGE | INTRAVENOUS | Status: DC | PRN
  Filled 2016-10-29: qty 5
  Filled 2016-10-29: qty 10

## 2016-10-29 MED ORDER — OXYCODONE-ACETAMINOPHEN 5-325 MG PO TABS
1.0000 | ORAL_TABLET | ORAL | Status: DC | PRN
Start: 2016-10-29 — End: 2016-10-30
  Administered 2016-10-30: 1 via ORAL
  Filled 2016-10-29: qty 1

## 2016-10-29 MED ORDER — SIMETHICONE 80 MG PO CHEW: 80.0000 mg | CHEWABLE_TABLET | ORAL | Status: DC | PRN

## 2016-10-29 MED ORDER — DIBUCAINE 1 % RE OINT: 1.0000 "application " | TOPICAL_OINTMENT | RECTAL | Status: DC | PRN

## 2016-10-29 MED ORDER — COCONUT OIL OIL: 1.0000 "application " | TOPICAL_OIL | Status: DC | PRN

## 2016-10-29 MED ORDER — LACTATED RINGERS IV SOLN
500.0000 mL | Freq: Once | INTRAVENOUS | Status: AC
  Administered 2016-10-29: 500 mL via INTRAVENOUS

## 2016-10-29 MED ORDER — MISOPROSTOL 25 MCG QUARTER TABLET
25.0000 ug | ORAL_TABLET | ORAL | Status: DC | PRN
  Filled 2016-10-29: qty 1

## 2016-10-29 MED ORDER — LIDOCAINE HCL (PF) 1 % IJ SOLN
30.0000 mL | INTRAMUSCULAR | Status: DC | PRN
  Filled 2016-10-29: qty 30

## 2016-10-29 MED ORDER — PHENYLEPHRINE 40 MCG/ML (10ML) SYRINGE FOR IV PUSH (FOR BLOOD PRESSURE SUPPORT)
80.0000 ug | PREFILLED_SYRINGE | INTRAVENOUS | Status: DC | PRN
  Filled 2016-10-29: qty 5

## 2016-10-29 MED ORDER — SENNOSIDES-DOCUSATE SODIUM 8.6-50 MG PO TABS
2.0000 | ORAL_TABLET | ORAL | Status: DC
  Administered 2016-10-29: 2 via ORAL
  Filled 2016-10-29: qty 2

## 2016-10-29 MED ORDER — ONDANSETRON HCL 4 MG/2ML IJ SOLN: 4.0000 mg | Freq: Four times a day (QID) | INTRAMUSCULAR | Status: DC | PRN

## 2016-10-29 MED ORDER — TETANUS-DIPHTH-ACELL PERTUSSIS 5-2.5-18.5 LF-MCG/0.5 IM SUSP: 0.5000 mL | Freq: Once | INTRAMUSCULAR | Status: DC

## 2016-10-29 MED ORDER — LACTATED RINGERS IV SOLN: 500.0000 mL | Freq: Once | INTRAVENOUS | Status: DC

## 2016-10-29 MED ORDER — BENZOCAINE-MENTHOL 20-0.5 % EX AERO
1.0000 "application " | INHALATION_SPRAY | CUTANEOUS | Status: DC | PRN
  Administered 2016-10-29: 1 via TOPICAL
  Filled 2016-10-29 (×2): qty 56

## 2016-10-29 MED ORDER — LACTATED RINGERS IV SOLN: 500.0000 mL | INTRAVENOUS | Status: DC | PRN

## 2016-10-29 MED ORDER — ONDANSETRON HCL 4 MG/2ML IJ SOLN: 4.0000 mg | INTRAMUSCULAR | Status: DC | PRN

## 2016-10-29 MED ORDER — OXYTOCIN 40 UNITS IN LACTATED RINGERS INFUSION - SIMPLE MED
2.5000 [IU]/h | INTRAVENOUS | Status: DC
  Administered 2016-10-29: 2.5 [IU]/h via INTRAVENOUS

## 2016-10-29 MED ORDER — TERBUTALINE SULFATE 1 MG/ML IJ SOLN
0.2500 mg | Freq: Once | INTRAMUSCULAR | Status: DC | PRN
  Filled 2016-10-29: qty 1

## 2016-10-29 MED ORDER — FENTANYL 2.5 MCG/ML BUPIVACAINE 1/10 % EPIDURAL INFUSION (WH - ANES)
14.0000 mL/h | INTRAMUSCULAR | Status: DC | PRN
  Administered 2016-10-29 (×2): 14 mL/h via EPIDURAL
  Filled 2016-10-29: qty 100

## 2016-10-29 MED ORDER — LIDOCAINE HCL (PF) 1 % IJ SOLN
INTRAMUSCULAR | Status: DC | PRN
  Administered 2016-10-29 (×2): 4 mL via EPIDURAL

## 2016-10-29 MED ORDER — DIPHENHYDRAMINE HCL 50 MG/ML IJ SOLN: 12.5000 mg | INTRAMUSCULAR | Status: DC | PRN

## 2016-10-29 MED ORDER — EPHEDRINE 5 MG/ML INJ
10.0000 mg | INTRAVENOUS | Status: DC | PRN
  Filled 2016-10-29: qty 2

## 2016-10-29 MED ORDER — ONDANSETRON HCL 4 MG PO TABS
4.0000 mg | ORAL_TABLET | ORAL | Status: DC | PRN
Start: 2016-10-29 — End: 2016-10-30

## 2016-10-29 MED ORDER — DIPHENHYDRAMINE HCL 25 MG PO CAPS: 25.0000 mg | ORAL_CAPSULE | Freq: Four times a day (QID) | ORAL | Status: DC | PRN

## 2016-10-29 MED ORDER — OXYTOCIN 40 UNITS IN LACTATED RINGERS INFUSION - SIMPLE MED
1.0000 m[IU]/min | INTRAVENOUS | Status: DC
  Administered 2016-10-29: 2 m[IU]/min via INTRAVENOUS
  Filled 2016-10-29: qty 1000

## 2016-10-29 MED ORDER — WITCH HAZEL-GLYCERIN EX PADS: 1.0000 "application " | MEDICATED_PAD | CUTANEOUS | Status: DC | PRN

## 2016-10-29 MED ORDER — LACTATED RINGERS IV SOLN
INTRAVENOUS | Status: DC
  Administered 2016-10-29: 08:00:00 via INTRAVENOUS

## 2016-10-29 MED ORDER — ZOLPIDEM TARTRATE 5 MG PO TABS
5.0000 mg | ORAL_TABLET | Freq: Every evening | ORAL | Status: DC | PRN
Start: 2016-10-29 — End: 2016-10-30

## 2016-10-29 MED ORDER — IBUPROFEN 600 MG PO TABS
600.0000 mg | ORAL_TABLET | Freq: Four times a day (QID) | ORAL | Status: DC
  Administered 2016-10-29 – 2016-10-30 (×4): 600 mg via ORAL
  Filled 2016-10-29 (×4): qty 1

## 2016-10-29 MED ORDER — PRENATAL MULTIVITAMIN CH
1.0000 | ORAL_TABLET | Freq: Every day | ORAL | Status: DC
  Administered 2016-10-30: 1 via ORAL
  Filled 2016-10-29: qty 1

## 2016-10-29 MED ORDER — SOD CITRATE-CITRIC ACID 500-334 MG/5ML PO SOLN: 30.0000 mL | ORAL | Status: DC | PRN

## 2016-10-29 MED ORDER — ACETAMINOPHEN 325 MG PO TABS
650.0000 mg | ORAL_TABLET | ORAL | Status: DC | PRN
Start: 2016-10-29 — End: 2016-10-30

## 2016-10-29 NOTE — Anesthesia Pain Management Evaluation Note (Signed)
  CRNA Pain Management Visit Note  Patient: Sophia RinkMary Reynolds, 26 y.o., female  "Hello I am a [facility] of the anesthesia [facility]. We have an anesthesia team available at all times to provide care throughout the hospital, including epidural management and anesthesia for C-section. I don't know your plan for the delivery whether it a natural birth, water birth, IV sedation, nitrous supplementation, doula or epidural, but we want to meet your pain goals."   1.Was your pain managed to your expectations on prior hospitalizations?   Yes   2.What is your expectation for pain management during this hospitalization?     Epidural and IV pain meds  3.How can we help you reach that goal? Be available  Record the patient's initial score and the patient's pain goal.   Pain: 4  Pain Goal: 5 [facility] wants you to be able to say your pain was always managed very well.  [facility] HospitalMERRITT,Ariz Terrones 10/29/2016

## 2016-10-29 NOTE — Progress Notes (Signed)
Sophia Reynolds is a 26 y.o. G2P1001 at 7215w2d by LMP admitted for induction of labor due to Elective at term.  Subjective: Patient comfortable s/p epidural  Objective: BP 112/68   Pulse 74   Temp 98.3 F (36.8 C) (Oral)   Resp 20   Ht 5\' 5"  (1.651 m)   Wt 71.2 kg (157 lb)   LMP 01/28/2016   SpO2 99%   BMI 26.13 kg/m  No intake/output data recorded. No intake/output data recorded.  FHT:  FHR: 140 bpm, variability: moderate,  accelerations:  Present,  decelerations:  Absent UC:   regular, every 2 minutes SVE:   Dilation: 7 Effacement (%): 100 Station: -2 Exam by:: Dr,. Weston Fulco AROM done clear fluid  Labs: Lab Results  Component Value Date   WBC 16.7 (H) 10/29/2016   HGB 12.3 10/29/2016   HCT 36.5 10/29/2016   MCV 94.1 10/29/2016   PLT 214 10/29/2016    Assessment / Plan: Induction of labor due to elective at term,  progressing well on pitocin  Labor: Progressing on Pitocin, s/p AROM Preeclampsia:  no signs or symptoms of toxicity Fetal Wellbeing:  Category I Pain Control:  Epidural I/D:  n/a Anticipated MOD:  NSVD  Sophia Reynolds Sophia Reynolds 10/29/2016, 1:27 PM

## 2016-10-29 NOTE — Anesthesia Procedure Notes (Signed)
Epidural Patient location during procedure: OB Start time: 10/29/2016 11:11 AM  Staffing Anesthesiologist: Mal AmabileFOSTER, Miski Feldpausch Performed: anesthesiologist   Preanesthetic Checklist Completed: patient identified, site marked, surgical consent, pre-op evaluation, timeout performed, IV checked, risks and benefits discussed and monitors and equipment checked  Epidural Patient position: sitting Prep: site prepped and draped and DuraPrep Patient monitoring: continuous pulse ox and blood pressure Approach: midline Location: L4-L5 Injection technique: LOR air  Needle:  Needle type: Tuohy  Needle gauge: 17 G Needle length: 9 cm and 9 Needle insertion depth: 4 cm Catheter type: closed end flexible Catheter size: 19 Gauge Catheter at skin depth: 9 cm Test dose: negative and Other  Assessment Events: blood not aspirated, injection not painful, no injection resistance, negative IV test and no paresthesia  Additional Notes Patient identified. Risks and benefits discussed including failed block, incomplete  Pain control, post dural puncture headache, nerve damage, paralysis, blood pressure Changes, nausea, vomiting, reactions to medications-both toxic and allergic and post Partum back pain. All questions were answered. Patient expressed understanding and wished to proceed. Sterile technique was used throughout procedure. Epidural site was Dressed with sterile barrier dressing. No paresthesias, signs of intravascular injection Or signs of intrathecal spread were encountered.  Patient was more comfortable after the epidural was dosed. Please see RN's note for documentation of vital signs and FHR which are stable.

## 2016-10-29 NOTE — Anesthesia Preprocedure Evaluation (Addendum)
Anesthesia Evaluation  Patient identified by MRN, date of birth, ID band Patient awake    Reviewed: Allergy & Precautions, Patient's Chart, lab work & pertinent test results  Airway Mallampati: II  TM Distance: >3 FB Neck ROM: Full    Dental no notable dental hx. (+) Teeth Intact   Pulmonary neg pulmonary ROS,    Pulmonary exam normal breath sounds clear to auscultation       Cardiovascular negative cardio ROS Normal cardiovascular exam+ Valvular Problems/Murmurs  Rhythm:Regular Rate:Normal     Neuro/Psych negative neurological ROS  negative psych ROS   GI/Hepatic Neg liver ROS, GERD  ,  Endo/Other  negative endocrine ROS  Renal/GU negative Renal ROS  negative genitourinary   Musculoskeletal negative musculoskeletal ROS (+)   Abdominal   Peds  Hematology negative hematology ROS (+)   Anesthesia Other Findings   Reproductive/Obstetrics (+) Pregnancy                             Anesthesia Physical Anesthesia Plan  ASA: II  Anesthesia Plan: Epidural   Post-op Pain Management:    Induction:   PONV Risk Score and Plan:   Airway Management Planned: Natural Airway  Additional Equipment:   Intra-op Plan:   Post-operative Plan:   Informed Consent: I have reviewed the patients History and Physical, chart, labs and discussed the procedure including the risks, benefits and alternatives for the proposed anesthesia with the patient or authorized representative who has indicated his/her understanding and acceptance.     Plan Discussed with: Anesthesiologist  Anesthesia Plan Comments:         Anesthesia Quick Evaluation

## 2016-10-29 NOTE — H&P (Signed)
Sophia Reynolds is a 26 y.o. female G2P1 presenting for elective induction at term.  She reports irregular mild ctx, no vaginal bleeding no leaking of fluid. Reports good fetal movement.   OB History    Gravida Para Term Preterm AB Living   2 1 1     1    SAB TAB Ectopic Multiple Live Births           1     Past Medical History:  Diagnosis Date  . Heart murmur    benign   Past Surgical History:  Procedure Laterality Date  . CHOLECYSTECTOMY N/A 10/12/2013   Procedure: LAPAROSCOPIC CHOLECYSTECTOMY WITH INTRAOPERATIVE CHOLANGIOGRAM;  Surgeon: Chevis PrettyPaul Toth III, MD;  Location: [facility] OR;  Service: General;  Laterality: N/A;  . WISDOM TOOTH EXTRACTION  2009   Family History: family history includes Coronary artery disease in her other; Diabetes in her maternal aunt and maternal grandfather; Heart disease in her maternal grandmother. Social History:  reports that she has never smoked. She has never used smokeless tobacco. She reports that she drinks alcohol. She reports that she does not use drugs.     Maternal Diabetes: No Genetic Screening: Declined Maternal Ultrasounds/Referrals: Normal Fetal Ultrasounds or other Referrals:  None Maternal Substance Abuse:  No Significant Maternal Medications:  None Significant Maternal Lab Results:  Lab values include: Group B Strep negative Other Comments:  None  Review of Systems  All other systems reviewed and are negative.  Maternal Medical History:  Contractions: Onset was less than 1 hour ago.   Frequency: rare.   Perceived severity is mild.    Fetal activity: Perceived fetal activity is normal.   Last perceived fetal movement was within the past hour.    Prenatal complications: no prenatal complications Prenatal Complications - Diabetes: none.    Dilation: 3 Effacement (%): 60 Station: -2 Exam by:: Raliegh Ipatherine Stout RN Blood pressure 108/71, pulse 93, temperature 98.8 F (37.1 C), temperature source Oral, resp. rate 18, height 5\' 5"   (1.651 m), weight 71.2 kg (157 lb), last menstrual period 01/28/2016, currently breastfeeding. Maternal Exam:  Uterine Assessment: Contraction strength is mild.  Contraction frequency is irregular.   Abdomen: Fundal height is 39cm.   Estimated fetal weight is 3100 grams.   Fetal presentation: vertex  Introitus: Normal vulva. Normal vagina.  Ferning test: not done.  Nitrazine test: not done. Amniotic fluid character: not assessed.  Pelvis: adequate for delivery.      Fetal Exam Fetal Monitor Review: Baseline rate: 140.  Variability: moderate (6-25 bpm).   Pattern: no decelerations and accelerations present.       Physical Exam  Nursing note and vitals reviewed.   Prenatal labs: ABO, Rh: --/--/A POS (10/17 [id number]) Antibody: PENDING (10/17 0729) Rubella: Immune (04/13 0000) RPR: Nonreactive (04/13 0000)  HBsAg: Negative (04/13 0000)  HIV: Non-reactive (04/13 0000)  GBS: Negative (09/19 0000)   Assessment/Plan: 26 year old G2P1 at 39 weeks 2 days for elective IOL at term Admit to L&D Continuous monitoring Pitocin for induction Epidural on demand.    Essie HartINN, Avery Eustice [name] 10/29/2016, 9:15 AM

## 2016-10-30 LAB — CBC
HCT: 32.9 % — ABNORMAL LOW (ref 36.0–46.0)
Hemoglobin: 11.1 g/dL — ABNORMAL LOW (ref 12.0–15.0)
MCH: 31.8 pg (ref 26.0–34.0)
MCHC: 33.7 g/dL (ref 30.0–36.0)
MCV: 94.3 fL (ref 78.0–100.0)
PLATELETS: 208 10*3/uL (ref 150–400)
RBC: 3.49 MIL/uL — ABNORMAL LOW (ref 3.87–5.11)
RDW: 13.5 % (ref 11.5–15.5)
WBC: 19.5 10*3/uL — AB (ref 4.0–10.5)

## 2016-10-30 MED ORDER — IBUPROFEN 600 MG PO TABS: 600.0000 mg | ORAL_TABLET | Freq: Four times a day (QID) | ORAL | 0 refills | Status: DC | PRN

## 2016-10-30 NOTE — Anesthesia Postprocedure Evaluation (Signed)
Anesthesia Post Note  Patient: Benard RinkMary Heiss  Procedure(s) Performed: AN AD HOC LABOR EPIDURAL     Patient location during evaluation: Mother Baby Anesthesia Type: Epidural Level of consciousness: awake and alert Pain management: pain level controlled Vital Signs Assessment: post-procedure vital signs reviewed and stable Respiratory status: spontaneous breathing Cardiovascular status: stable Postop Assessment: no headache, epidural receding, no backache, patient able to bend at knees, no apparent nausea or vomiting and adequate PO intake Anesthetic complications: no    Last Vitals:  Vitals:   10/29/16 2130 10/30/16 0535  BP: 115/71 111/72  Pulse: 93 82  Resp: 18 18  Temp: 36.9 C 36.9 C  SpO2: 99% 99%    Last Pain:  Vitals:   10/30/16 0538  TempSrc:   PainSc: 8    Pain Goal:                 Salome ArntSterling, Ghislaine Harcum Marie

## 2016-10-30 NOTE — Discharge Summary (Signed)
Obstetric Discharge Summary Reason for Admission: induction of labor Prenatal Procedures: none Intrapartum Procedures: spontaneous vaginal delivery Postpartum Procedures: none Complications-Operative and Postpartum: none Hemoglobin  Date Value Ref Range Status  10/30/2016 11.1 (L) 12.0 - 15.0 g/dL Final   HCT  Date Value Ref Range Status  10/30/2016 32.9 (L) 36.0 - 46.0 % Final    Physical Exam:  General: alert, cooperative and appears stated age 27Lochia: appropriate Uterine Fundus: firm DVT Evaluation: No evidence of DVT seen on physical exam.  Discharge Diagnoses: Term Pregnancy-delivered  Discharge Information: Date: 10/30/2016 Activity: pelvic rest Diet: routine Medications: PNV and Ibuprofen Condition: stable Instructions: refer to practice specific booklet Discharge to: home Follow-up Information    Sophia Reynolds, Walda, MD Follow up in 4 week(s).   Specialty:  Obstetrics and Gynecology Contact information: [address] [place] [address] [phone]           Newborn Data: Live born female  Birth Weight: 9 lb 4 oz (4195 g) APGAR: 8, 9  Newborn Delivery   Birth date/time:  10/29/2016 15:07:00 Delivery type:  Vaginal, Spontaneous Delivery      Home with mother.  Sophia Reynolds Sophia Reynolds Sophia Reynolds 10/30/2016, 8:21 AM

## 2016-10-30 NOTE — Progress Notes (Signed)
Patient is doing well.  She is ambulating, voiding, tolerating PO.  Pain control is good.  Lochia is appropriate C/o labial swelling.  Concerned as she had a vulvar hematoma with last delivery  Vitals:   10/29/16 1730 10/29/16 1830 10/29/16 2130 10/30/16 0535  BP: (!) 105/91 110/63 115/71 111/72  Pulse: 77 91 93 82  Resp: 16 16 18 18   Temp: 98.8 F (37.1 C) 98.8 F (37.1 C) 98.4 F (36.9 C) 98.4 F (36.9 C)  TempSrc: Oral Oral Oral Oral  SpO2:   99% 99%  Weight:      Height:        NAD Fundus firm Perineum--moderate swelling of vulva, L>R.  Swelling limited to labia minora.  Edema only--tissue pink, no ecchymosis c/w hematoma.  Gentle vaginal exam reveals NO extension of edema to the vagina.   LE: No edema  Lab Results  Component Value Date   WBC 19.5 (H) 10/30/2016   HGB 11.1 (L) 10/30/2016   HCT 32.9 (L) 10/30/2016   MCV 94.3 10/30/2016   PLT 208 10/30/2016    --/--/A POS (10/17 0729)/RImmune  A/P 26 y.o. G2P2002 PPD#1 s/p TSVD. Routine care.   Desires d/c to home today.  Meeting all goals Vulvar edema, no evidence of hematoma.  Ice packs prn.    Desires circumcision. Discussed r/b/a of the procedure. Reviewed that circumcision is an elective surgical procedure and not considered medically necessary. Reviewed the risks of the procedure including the risk of infection, bleeding, damage to surrounding structures, including scrotum, shaft, urethra and head of penis, and an undesired cosmetic effect requiring additional procedures for revision. Consent signed.    [facility] Center[facility]ANNA [name] [employer]CLARK

## 2017-07-06 DIAGNOSIS — Z01419 Encounter for gynecological examination (general) (routine) without abnormal findings: Secondary | ICD-10-CM | POA: Diagnosis not present

## 2017-07-06 DIAGNOSIS — R222 Localized swelling, mass and lump, trunk: Secondary | ICD-10-CM | POA: Diagnosis not present

## 2017-12-14 DIAGNOSIS — R5383 Other fatigue: Secondary | ICD-10-CM | POA: Diagnosis not present

## 2018-01-15 DIAGNOSIS — R42 Dizziness and giddiness: Secondary | ICD-10-CM | POA: Diagnosis not present

## 2018-01-17 ENCOUNTER — Emergency Department (HOSPITAL_COMMUNITY): Payer: 59

## 2018-01-17 ENCOUNTER — Emergency Department (HOSPITAL_COMMUNITY)
Admission: EM | Admit: 2018-01-17 | Discharge: 2018-01-17 | Disposition: A | Discharge status: Home / Self Care | Payer: 59 | Attending: Emergency Medicine | Admitting: Emergency Medicine

## 2018-01-17 ENCOUNTER — Other Ambulatory Visit: Payer: Self-pay

## 2018-01-17 ENCOUNTER — Encounter (HOSPITAL_COMMUNITY): Payer: Self-pay | Admitting: Emergency Medicine

## 2018-01-17 DIAGNOSIS — R42 Dizziness and giddiness: Secondary | ICD-10-CM | POA: Insufficient documentation

## 2018-01-17 DIAGNOSIS — R531 Weakness: Secondary | ICD-10-CM | POA: Diagnosis not present

## 2018-01-17 DIAGNOSIS — G44229 Chronic tension-type headache, not intractable: Secondary | ICD-10-CM | POA: Diagnosis not present

## 2018-01-17 DIAGNOSIS — R51 Headache: Secondary | ICD-10-CM | POA: Diagnosis not present

## 2018-01-17 LAB — CBC WITH DIFFERENTIAL/PLATELET
ABS IMMATURE GRANULOCYTES: 0.04 10*3/uL (ref 0.00–0.07)
Basophils Absolute: 0 10*3/uL (ref 0.0–0.1)
Basophils Relative: 0 %
EOS ABS: 0.1 10*3/uL (ref 0.0–0.5)
EOS PCT: 1 %
HCT: 44.3 % (ref 36.0–46.0)
Hemoglobin: 14.5 g/dL (ref 12.0–15.0)
Immature Granulocytes: 1 %
Lymphocytes Relative: 19 %
Lymphs Abs: 1.5 10*3/uL (ref 0.7–4.0)
MCH: 31.4 pg (ref 26.0–34.0)
MCHC: 32.7 g/dL (ref 30.0–36.0)
MCV: 95.9 fL (ref 80.0–100.0)
MONOS PCT: 7 %
Monocytes Absolute: 0.5 10*3/uL (ref 0.1–1.0)
Neutro Abs: 5.7 10*3/uL (ref 1.7–7.7)
Neutrophils Relative %: 72 %
PLATELETS: 292 10*3/uL (ref 150–400)
RBC: 4.62 MIL/uL (ref 3.87–5.11)
RDW: 12.1 % (ref 11.5–15.5)
WBC: 7.9 10*3/uL (ref 4.0–10.5)
nRBC: 0 % (ref 0.0–0.2)

## 2018-01-17 LAB — URINALYSIS, ROUTINE W REFLEX MICROSCOPIC
BILIRUBIN URINE: NEGATIVE
Glucose, UA: NEGATIVE mg/dL
HGB URINE DIPSTICK: NEGATIVE
Ketones, ur: NEGATIVE mg/dL
Nitrite: NEGATIVE
PROTEIN: NEGATIVE mg/dL
SPECIFIC GRAVITY, URINE: 1.021 (ref 1.005–1.030)
pH: 6 (ref 5.0–8.0)

## 2018-01-17 LAB — BASIC METABOLIC PANEL
Anion gap: 6 (ref 5–15)
BUN: 14 mg/dL (ref 6–20)
CO2: 24 mmol/L (ref 22–32)
CREATININE: 0.66 mg/dL (ref 0.44–1.00)
Calcium: 9 mg/dL (ref 8.9–10.3)
Chloride: 108 mmol/L (ref 98–111)
GFR calc Af Amer: >60 mL/min (ref >60)
GLUCOSE: 100 mg/dL — AB (ref 70–99)
POTASSIUM: 3.5 mmol/L (ref 3.5–5.1)
SODIUM: 138 mmol/L (ref 135–145)

## 2018-01-17 LAB — PREGNANCY, URINE: PREG TEST UR: NEGATIVE

## 2018-01-17 MED ORDER — SODIUM CHLORIDE 0.9 % IV BOLUS
1000.0000 mL | Freq: Once | INTRAVENOUS | Status: AC
  Administered 2018-01-17: 1000 mL via INTRAVENOUS

## 2018-01-17 MED ORDER — MECLIZINE HCL 25 MG PO TABS: 25.0000 mg | ORAL_TABLET | Freq: Three times a day (TID) | ORAL | 0 refills | Status: DC | PRN

## 2018-01-17 MED ORDER — MECLIZINE HCL 12.5 MG PO TABS
25.0000 mg | ORAL_TABLET | Freq: Once | ORAL | Status: AC
  Administered 2018-01-17: 25 mg via ORAL
  Filled 2018-01-17: qty 2

## 2018-01-17 MED ORDER — PROCHLORPERAZINE EDISYLATE 10 MG/2ML IJ SOLN
5.0000 mg | Freq: Once | INTRAMUSCULAR | Status: AC
  Administered 2018-01-17: 5 mg via INTRAVENOUS
  Filled 2018-01-17: qty 2

## 2018-01-17 MED ORDER — KETOROLAC TROMETHAMINE 30 MG/ML IJ SOLN
15.0000 mg | Freq: Once | INTRAMUSCULAR | Status: AC
  Administered 2018-01-17: 15 mg via INTRAVENOUS
  Filled 2018-01-17: qty 1

## 2018-01-17 MED ORDER — DIPHENHYDRAMINE HCL 50 MG/ML IJ SOLN
12.5000 mg | Freq: Once | INTRAMUSCULAR | Status: AC
  Administered 2018-01-17: 12.5 mg via INTRAVENOUS
  Filled 2018-01-17: qty 1

## 2018-01-17 MED ORDER — ISOMETHEPTENE-DICHLORAL-APAP 65-100-325 MG PO CAPS: 1.0000 | ORAL_CAPSULE | Freq: Four times a day (QID) | ORAL | 0 refills | Status: DC | PRN

## 2018-01-17 NOTE — ED Notes (Signed)
Patient reports feeling her heart racing and hot all over. Patient put on cardiac monitor. EKG done. HR 72 Blood pressure 88/59. Patient resp increased. EDP and EDPa made aware, orders to be given.

## 2018-01-17 NOTE — ED Provider Notes (Signed)
[facility]   CSN: [id number] Arrival date & time: 01/17/18  1034     History   Chief Complaint Chief Complaint  Patient presents with  . Dizziness    HPI Sophia Reynolds is a 28 y.o. female with no significant past medical history but presents with a one-month history of intermittent but progressively worsening headaches along with dizziness which she describes as a subtle transient room movement with positional changes of her head. She denies any other visual changes but endorses photosensitivity. 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  Her headache is described as a band of pressure around her head.  She has taken otc medicines including tylenol and excedrin without relief.  She has had no fevers, chills, focal weakness, nausea or vomiting, denies sinus pain, neck pain or stiffness, no rash.  Today she felt generalized fatigue and weakness, prompting this ed visit.  HPI  Past Medical History:  Diagnosis Date  . Heart murmur    benign    Patient Active Problem List   Diagnosis Date Noted  . [redacted] weeks gestation of pregnancy 10/29/2016  . Indication for care in labor or delivery 08/27/2013  . Normal vaginal delivery 08/27/2013    Past Surgical History:  Procedure Laterality Date  . CHOLECYSTECTOMY N/A 10/12/2013   Procedure: LAPAROSCOPIC CHOLECYSTECTOMY WITH INTRAOPERATIVE CHOLANGIOGRAM;  Surgeon: Chevis PrettyPaul Toth III, MD;  Location: [facility] OR;  Service: General;  Laterality: N/A;  . WISDOM TOOTH EXTRACTION  2009     OB History    Gravida  2   Para  2   Term  2   Preterm      AB      Living  2     SAB      TAB      Ectopic      Multiple  0   Live Births  2            Home Medications    Prior to  Admission medications   Medication Sig Start Date End Date Taking? Authorizing Provider  mometasone (NASONEX) 50 MCG/ACT nasal spray Place 1 spray into the nose daily as needed.   Yes [provider]  isometheptene-acetaminophen-dichloralphenazone (MIDRIN) 65-100-325 MG capsule Take 1 capsule by mouth 4 (four) times daily as needed for migraine. Maximum 5 capsules in 12 hours for migraine headaches, 8 capsules in 24 hours for tension headaches. 01/17/18   Burgess AmorIdol, Clair Bardwell, PA-C  meclizine (ANTIVERT) 25 MG tablet Take 1 tablet (25 mg total) by mouth 3 (three) times daily as needed for dizziness. 01/17/18   Burgess AmorIdol, Galadriel Shroff, PA-C    Family History Family History  Problem Relation Age of Onset  . Coronary artery disease Other   . Diabetes Maternal Aunt   . Heart disease Maternal Grandmother   . Diabetes Maternal Grandfather     Social History Social History   Tobacco Use  . Smoking status: Never Smoker  . Smokeless tobacco: Never Used  Substance Use Topics  . Alcohol use: Yes    Comment: occasional in the past  . Drug use: No     Allergies   Patient has no known allergies.   Review of Systems Review of Systems  Constitutional: Negative  for chills and fever.  HENT: Negative for congestion and sore throat.   Eyes: Positive for photophobia. Negative for pain and redness.  Respiratory: Negative for chest tightness and shortness of breath.   Cardiovascular: Negative for chest pain.  Gastrointestinal: Negative for abdominal pain, nausea and vomiting.  Genitourinary: Negative.   Musculoskeletal: Negative for arthralgias, joint swelling, neck pain and neck stiffness.  Skin: Negative.  Negative for rash and wound.  Neurological: Positive for dizziness, weakness and headaches. Negative for light-headedness and numbness.  Psychiatric/Behavioral: Negative.      Physical Exam Updated Vital Signs BP 105/69 (BP Location: Left Arm)   Pulse 74   Temp 98 F (36.7 C) (Oral)   Resp 16    Ht 5\' 5"  (1.651 m)   Wt 58.1 kg   LMP 01/01/2018 Comment: shielded  SpO2 100%   BMI 21.30 kg/m   Physical Exam Vitals signs and nursing note reviewed.  Constitutional:      Appearance: Normal appearance. She is well-developed.  HENT:     Head: Normocephalic and atraumatic.     Right Ear: Tympanic membrane normal.     Left Ear: Tympanic membrane normal.  Eyes:     Extraocular Movements: Extraocular movements intact.     Conjunctiva/sclera: Conjunctivae normal.     Pupils: Pupils are equal, round, and reactive to light.  Neck:     Musculoskeletal: Normal range of motion and neck supple. No neck rigidity.  Cardiovascular:     Rate and Rhythm: Normal rate.     Heart sounds: Normal heart sounds.  Pulmonary:     Effort: Pulmonary effort is normal.  Musculoskeletal: Normal range of motion.  Lymphadenopathy:     Cervical: No cervical adenopathy.  Skin:    General: Skin is warm and dry.     Findings: No rash.  Neurological:     Mental Status: She is alert and oriented to person, place, and time.     GCS: GCS eye subscore is 4. GCS verbal subscore is 5. GCS motor subscore is 6.     Sensory: No sensory deficit.     Gait: Gait normal.     Comments: Normal heel-shin, normal rapid alternating movements. Cranial nerves III-XII intact.  No pronator drift.  Psychiatric:        Speech: Speech normal.        Behavior: Behavior normal.        Thought Content: Thought content normal.      ED Treatments / Results  Labs (all labs ordered are listed, but only abnormal results are displayed) Labs Reviewed  BASIC METABOLIC PANEL - Abnormal; Notable for the following components:      Result Value   Glucose, Bld 100 (*)    All other components within normal limits  URINALYSIS, ROUTINE W REFLEX MICROSCOPIC - Abnormal; Notable for the following components:   APPearance HAZY (*)    Leukocytes, UA TRACE (*)    Bacteria, UA RARE (*)    All other components within normal limits  CBC WITH  DIFFERENTIAL/PLATELET  PREGNANCY, URINE    EKG None  Radiology Ct Head Wo Contrast  Result Date: 01/17/2018 CLINICAL DATA:  Vertigo. EXAM: CT HEAD WITHOUT CONTRAST TECHNIQUE: Contiguous axial images were obtained from the base of the skull through the vertex without intravenous contrast. COMPARISON:  None. FINDINGS: Brain: No evidence of acute infarction, hemorrhage, hydrocephalus, extra-axial collection or mass lesion/mass effect. Vascular: No hyperdense vessel or unexpected calcification. Skull: Normal. Negative for fracture or focal lesion.  Sinuses/Orbits: No acute finding. Other: None. IMPRESSION: 1. Normal brain. Electronically Signed   By: Signa Kell M.D.   On: 01/17/2018 12:52    Procedures Procedures (including critical care time)  Medications Ordered in ED Medications  meclizine (ANTIVERT) tablet 25 mg (25 mg Oral Given 01/17/18 1220)  ketorolac (TORADOL) 30 MG/ML injection 15 mg (15 mg Intravenous Given 01/17/18 1359)  prochlorperazine (COMPAZINE) injection 5 mg (5 mg Intravenous Given 01/17/18 1447)  diphenhydrAMINE (BENADRYL) injection 12.5 mg (12.5 mg Intravenous Given 01/17/18 1446)  sodium chloride 0.9 % bolus 1,000 mL (0 mLs Intravenous Stopped 01/17/18 1617)     Initial Impression / Assessment and Plan / ED Course  I have reviewed the triage vital signs and the nursing notes.  Pertinent labs & imaging results that were available during my care of the patient were reviewed by me and considered in my medical decision making (see chart for details).     Labs and CT imaging was reviewed and discussed with patient.  At reevaluation her dizzy sensation was resolved after receiving meclizine.  She still endorsed headache after receiving Toradol.  She was given a small dose of Compazine with Benadryl which adequately improved the headache, however she became anxious and had a transient episode of hypotension.  She was given a liter of IV fluids and observed in the department and  her symptoms resolved.  She was symptom-free at time of discharge.  She was prescribed meclizine for any continued dizziness.  She was also prescribed Midrin to try for her headache as I suspect her headache is stress/tension induced.  She was encouraged to keep her eye appointment in 4 days as well.  Final Clinical Impressions(s) / ED Diagnoses   Final diagnoses:  Chronic tension-type headache, not intractable  Vertigo    ED Discharge Orders         Ordered    meclizine (ANTIVERT) 25 MG tablet  3 times daily PRN     01/17/18 1437    isometheptene-acetaminophen-dichloralphenazone (MIDRIN) 65-100-325 MG capsule  4 times daily PRN     01/17/18 1437           Burgess Amor, PA-C 01/17/18 1644    Benjiman Core, MD 01/20/18 1451

## 2018-01-17 NOTE — ED Triage Notes (Addendum)
Patient c/o constant headaches with dizziness x1 week. Patient states intermittent headaches and dizziness x1 month. Patient seen by PCP twice, told stress induced first and then vertigo-no given any medications. Patient states occasional nausea, blurred vision,  and photosensitivity. Generalized weakness starting today. Patient states dizziness is worse with movement of head.

## 2018-01-17 NOTE — ED Notes (Signed)
Patient reports improvement in dizziness but states headache has gotten worse. EDPa unavailable at this time. EDP made aware.

## 2018-01-17 NOTE — Discharge Instructions (Addendum)
Use the medicines as prescribed if needed for vertigo and headache.

## 2018-01-21 ENCOUNTER — Encounter (HOSPITAL_COMMUNITY): Payer: Self-pay | Admitting: Emergency Medicine

## 2018-01-21 ENCOUNTER — Emergency Department (HOSPITAL_COMMUNITY)
Admission: EM | Admit: 2018-01-21 | Discharge: 2018-01-21 | Disposition: A | Discharge status: Home / Self Care | Payer: 59 | Attending: Emergency Medicine | Admitting: Emergency Medicine

## 2018-01-21 ENCOUNTER — Emergency Department (HOSPITAL_COMMUNITY): Payer: 59

## 2018-01-21 DIAGNOSIS — R0602 Shortness of breath: Secondary | ICD-10-CM | POA: Diagnosis not present

## 2018-01-21 DIAGNOSIS — R0789 Other chest pain: Secondary | ICD-10-CM | POA: Diagnosis not present

## 2018-01-21 DIAGNOSIS — F419 Anxiety disorder, unspecified: Secondary | ICD-10-CM | POA: Diagnosis not present

## 2018-01-21 DIAGNOSIS — R52 Pain, unspecified: Secondary | ICD-10-CM | POA: Diagnosis not present

## 2018-01-21 LAB — BASIC METABOLIC PANEL
Anion gap: 8 (ref 5–15)
BUN: 6 mg/dL (ref 6–20)
CHLORIDE: 108 mmol/L (ref 98–111)
CO2: 26 mmol/L (ref 22–32)
CREATININE: 0.76 mg/dL (ref 0.44–1.00)
Calcium: 9.5 mg/dL (ref 8.9–10.3)
GFR calc Af Amer: >60 mL/min (ref >60)
GFR calc non Af Amer: >60 mL/min (ref >60)
Glucose, Bld: 95 mg/dL (ref 70–99)
Potassium: 3.4 mmol/L — ABNORMAL LOW (ref 3.5–5.1)
Sodium: 142 mmol/L (ref 135–145)

## 2018-01-21 LAB — CBC
HEMATOCRIT: 42.4 % (ref 36.0–46.0)
Hemoglobin: 14.2 g/dL (ref 12.0–15.0)
MCH: 32.4 pg (ref 26.0–34.0)
MCHC: 33.5 g/dL (ref 30.0–36.0)
MCV: 96.8 fL (ref 80.0–100.0)
Platelets: 315 10*3/uL (ref 150–400)
RBC: 4.38 MIL/uL (ref 3.87–5.11)
RDW: 11.9 % (ref 11.5–15.5)
WBC: 8.8 10*3/uL (ref 4.0–10.5)
nRBC: 0 % (ref 0.0–0.2)

## 2018-01-21 LAB — I-STAT BETA HCG BLOOD, ED (MC, WL, AP ONLY): I-stat hCG, quantitative: <5 m[IU]/mL (ref <5)

## 2018-01-21 LAB — I-STAT TROPONIN, ED: Troponin i, poc: 0 ng/mL (ref 0.00–0.08)

## 2018-01-21 NOTE — ED Notes (Signed)
Patient able to ambulate independently  

## 2018-01-21 NOTE — Discharge Instructions (Signed)

## 2018-01-21 NOTE — ED Triage Notes (Signed)
Per [facility]:  Patient presents for sudden onset of chest pain, SOB and anxiety while driving.  HR 100, patient pale and diaphoretic on EMS arrival.  No other associated symptoms, no radiation.  Patient had recent diagnosis of migraine headaches at [facility].   Has not started any new meds.

## 2018-01-21 NOTE — ED Provider Notes (Signed)
[facility] EMERGENCY DEPARTMENT Provider Note   CSN: [id number] Arrival date & time: 01/21/18  1359     History   Chief Complaint Chief Complaint  Patient presents with  . Chest Pain    HPI Sophia Reynolds is a 28 y.o. female with a past medical history of heart murmur, who presents today for evaluation of chest pain, shortness of breath  Today.  She was driving and says  That the symptoms were bad for about an hour- hour and a half.  Felt light headed like she was going to pass out and nauseous with out vomiting.    Currently she feels much better.  She reports a 1 out of 10 tightness in her chest, reports that the shortness of breath has resolved, she no longer feels nauseous or lightheaded.  She says that she was able to "talk myself down."  She reports feeling like there is a tight band around her head that has been there since her ED visit on 1/5 for headache.  No fevers, coughing.  No hormone use, no hemoptysis, no recent surgery or immobilization, no strong family history of clotting or bleading disorders or history of DVT/PE.    Patient and her mother provide history.  Mother reports that patient has been under significant stress recently, including recent death of dog, moving and multiple other life stressors.   HPI  Past Medical History:  Diagnosis Date  . Heart murmur    benign    Patient Active Problem List   Diagnosis Date Noted  . [redacted] weeks gestation of pregnancy 10/29/2016  . Indication for care in labor or delivery 08/27/2013  . Normal vaginal delivery 08/27/2013    Past Surgical History:  Procedure Laterality Date  . CHOLECYSTECTOMY N/A 10/12/2013   Procedure: LAPAROSCOPIC CHOLECYSTECTOMY WITH INTRAOPERATIVE CHOLANGIOGRAM;  Surgeon: Chevis Pretty III, MD;  Location: [facility] OR;  Service: General;  Laterality: N/A;  . WISDOM TOOTH EXTRACTION  2009     OB History    Gravida  2   Para  2   Term  2   Preterm      AB      Living  2     SAB      TAB      Ectopic      Multiple  0   Live Births  2            Home Medications    Prior to Admission medications   Medication Sig Start Date End Date Taking? Authorizing Provider  isometheptene-acetaminophen-dichloralphenazone (MIDRIN) 65-100-325 MG capsule Take 1 capsule by mouth 4 (four) times daily as needed for migraine. Maximum 5 capsules in 12 hours for migraine headaches, 8 capsules in 24 hours for tension headaches. 01/17/18   Burgess Amor, PA-C  meclizine (ANTIVERT) 25 MG tablet Take 1 tablet (25 mg total) by mouth 3 (three) times daily as needed for dizziness. 01/17/18   Idol, Raynelle Fanning, PA-C  mometasone (NASONEX) 50 MCG/ACT nasal spray Place 1 spray into the nose daily as needed.    [provider]    Family History Family History  Problem Relation Age of Onset  . Coronary artery disease Other   . Diabetes Maternal Aunt   . Heart disease Maternal Grandmother   . Diabetes Maternal Grandfather     Social History Social History   Tobacco Use  . Smoking status: Never Smoker  . Smokeless tobacco: Never Used  Substance Use Topics  . Alcohol use: Yes  Comment: occasional in the past  . Drug use: No     Allergies   Patient has no known allergies.   Review of Systems Review of Systems  Constitutional: Negative for appetite change and fever.  HENT: Negative for congestion.   Eyes: Negative for visual disturbance.  Respiratory: Positive for chest tightness and shortness of breath. Negative for apnea, cough and wheezing.   Cardiovascular: Positive for chest pain. Negative for palpitations and leg swelling.  Gastrointestinal: Positive for nausea. Negative for abdominal pain, diarrhea and vomiting.  Genitourinary: Negative for dysuria.  Musculoskeletal: Negative for neck pain and neck stiffness.  Skin: Negative for color change and rash.  Neurological: Positive for light-headedness (Resolved) and headaches. Negative for weakness.  All other  systems reviewed and are negative.    Physical Exam Updated Vital Signs BP 105/61 (BP Location: Right Arm)   Pulse 72   Temp 97.8 F (36.6 C) (Oral)   Resp 20   LMP 01/01/2018 Comment: shielded  SpO2 99%   Physical Exam Vitals signs and nursing note reviewed.  Constitutional:      General: She is not in acute distress.    Appearance: She is well-developed. She is not ill-appearing.  HENT:     Head: Normocephalic and atraumatic.  Eyes:     Conjunctiva/sclera: Conjunctivae normal.  Neck:     Musculoskeletal: Normal range of motion and neck supple.     Vascular: No JVD.  Cardiovascular:     Rate and Rhythm: Normal rate and regular rhythm.     Pulses:          Radial pulses are 2+ on the right side and 2+ on the left side.       Dorsalis pedis pulses are 2+ on the right side and 2+ on the left side.       Posterior tibial pulses are 2+ on the right side and 2+ on the left side.     Heart sounds: Murmur present.  Pulmonary:     Effort: Pulmonary effort is normal. No accessory muscle usage or respiratory distress.     Breath sounds: Normal breath sounds. No decreased breath sounds or wheezing.  Chest:     Chest wall: Tenderness (There is mild tenderness to palpation bilaterally over sternocostal joints.  Palpation here both re-creates and exacerbates her reported tightness.) present. No deformity.  Abdominal:     General: Bowel sounds are normal.     Palpations: Abdomen is soft.     Tenderness: There is no abdominal tenderness.  Musculoskeletal: Normal range of motion.     Right lower leg: She exhibits no tenderness. No edema.     Left lower leg: She exhibits no tenderness. No edema.     Comments: There is mild tenderness to palpation bilaterally over cervical paraspinal muscles.  Palpation here worsens her reported bandlike headache.  Lymphadenopathy:     Cervical: No cervical adenopathy.  Skin:    General: Skin is warm and dry.  Neurological:     General: No focal  deficit present.     Mental Status: She is alert.  Psychiatric:        Mood and Affect: Mood normal.        Behavior: Behavior normal.     ED Treatments / Results  Labs (all labs ordered are listed, but only abnormal results are displayed) Labs Reviewed  BASIC METABOLIC PANEL - Abnormal; Notable for the following components:      Result Value   Potassium 3.4 (*)  All other components within normal limits  CBC  I-STAT TROPONIN, ED  I-STAT BETA HCG BLOOD, ED ([facility], [facility], [facility] ONLY)    EKG EKG Interpretation  Date/Time:  Thursday January 21 2018 14:15:32 EST Ventricular Rate:  85 PR Interval:  114 QRS Duration: 78 QT Interval:  350 QTC Calculation: 416 R Axis:   83 Text Interpretation:  Normal sinus rhythm Nonspecific ST abnormality Abnormal ECG No significant change since last tracing Confirmed by Alvira MondaySchlossman, Erin ([id number]) on 01/21/2018 4:26:22 PM   Radiology Dg Chest 2 View  Result Date: 01/21/2018 CLINICAL DATA:  Shortness of breath; chest heaviness EXAM: CHEST - 2 VIEW COMPARISON:  February 20, 2009 FINDINGS: There is no edema or consolidation. Heart size and pulmonary vascularity are normal. No adenopathy. No pneumothorax. No bone lesions. Asymmetric breast tissue on the right compared to the left, a stable finding. IMPRESSION: No edema or consolidation.  Heart size normal. Electronically Signed   By: Bretta BangWilliam  Woodruff III M.D.   On: 01/21/2018 14:34    Procedures Procedures (including critical care time)  Medications Ordered in ED Medications - No data to display   Initial Impression / Assessment and Plan / ED Course  I have reviewed the triage vital signs and the nursing notes.  Pertinent labs & imaging results that were available during my care of the patient were reviewed by me and considered in my medical decision making (see chart for details).     Sophia Reynolds presents today for evaluation of chest pain and shortness of breath.  Here she is not tachycardic or  tachypneic, is afebrile.  Chest x-ray does not show edema or consolidation with normal heart size.  She is aware of the asymmetric breast tissue.    Electrolytes are normal.  CBC is normal.  She is not pregnant, troponin is not elevated.  She has not had any recent surgeries, immobilizations, hemoptysis or leg swelling.  She does not take birth control.  She is PERC negative.  Heart score 0.    Her physical exam is consistent with musculoskeletal chest pain.  I suspect that she has a significant amount of anxiety on top of this worsening her symptoms.    We discussed ways to help reduce anxiety in addition to treatment conservatively for costochondritis.  Return precautions were discussed with patient who states their understanding.  At the time of discharge patient denied any unaddressed complaints or concerns.  Patient is agreeable for discharge home.   Final Clinical Impressions(s) / ED Diagnoses   Final diagnoses:  Atypical chest pain  Anxiety    ED Discharge Orders    None       Norman ClayHammond, Elizabeth W, PA-C 01/21/18 2245    Alvira MondaySchlossman, Erin, MD 01/22/18 [phone]

## 2018-01-27 DIAGNOSIS — R509 Fever, unspecified: Secondary | ICD-10-CM | POA: Diagnosis not present

## 2018-01-27 DIAGNOSIS — R51 Headache: Secondary | ICD-10-CM | POA: Diagnosis not present

## 2018-01-27 DIAGNOSIS — B349 Viral infection, unspecified: Secondary | ICD-10-CM | POA: Diagnosis not present

## 2018-01-27 DIAGNOSIS — J029 Acute pharyngitis, unspecified: Secondary | ICD-10-CM | POA: Diagnosis not present

## 2018-01-27 DIAGNOSIS — R591 Generalized enlarged lymph nodes: Secondary | ICD-10-CM | POA: Diagnosis not present

## 2018-01-30 DIAGNOSIS — R202 Paresthesia of skin: Secondary | ICD-10-CM | POA: Diagnosis not present

## 2018-01-30 DIAGNOSIS — M79609 Pain in unspecified limb: Secondary | ICD-10-CM | POA: Diagnosis not present

## 2018-02-05 DIAGNOSIS — R51 Headache: Secondary | ICD-10-CM | POA: Diagnosis not present

## 2018-02-05 DIAGNOSIS — R200 Anesthesia of skin: Secondary | ICD-10-CM | POA: Diagnosis not present

## 2018-02-10 DIAGNOSIS — R200 Anesthesia of skin: Secondary | ICD-10-CM | POA: Diagnosis not present

## 2018-02-11 ENCOUNTER — Other Ambulatory Visit: Payer: Self-pay

## 2018-02-11 DIAGNOSIS — R200 Anesthesia of skin: Secondary | ICD-10-CM

## 2018-02-26 ENCOUNTER — Ambulatory Visit
Admission: RE | Admit: 2018-02-26 | Discharge: 2018-02-26 | Disposition: A | Discharge status: Home / Self Care | Payer: 59 | Source: Ambulatory Visit | Attending: Family Medicine | Admitting: Family Medicine

## 2018-02-26 DIAGNOSIS — R200 Anesthesia of skin: Secondary | ICD-10-CM

## 2018-02-26 MED ORDER — GADOBENATE DIMEGLUMINE 529 MG/ML IV SOLN
10.0000 mL | Freq: Once | INTRAVENOUS | Status: AC | PRN
  Administered 2018-02-26: 10 mL via INTRAVENOUS

## 2020-03-13 ENCOUNTER — Ambulatory Visit
Admission: RE | Admit: 2020-03-13 | Discharge: 2020-03-13 | Disposition: A | Discharge status: Home / Self Care | Payer: 59 | Source: Ambulatory Visit | Attending: Emergency Medicine | Admitting: Emergency Medicine

## 2020-03-13 ENCOUNTER — Other Ambulatory Visit: Payer: Self-pay

## 2020-03-13 VITALS — BP 133/85 | HR 105 | Temp 98.9°F | Resp 18

## 2020-03-13 DIAGNOSIS — R1011 Right upper quadrant pain: Secondary | ICD-10-CM | POA: Diagnosis not present

## 2020-03-13 DIAGNOSIS — R109 Unspecified abdominal pain: Secondary | ICD-10-CM | POA: Diagnosis present

## 2020-03-13 LAB — POCT URINALYSIS DIP (MANUAL ENTRY)
Bilirubin, UA: NEGATIVE
Glucose, UA: NEGATIVE mg/dL
Ketones, POC UA: NEGATIVE mg/dL
Leukocytes, UA: NEGATIVE
Nitrite, UA: NEGATIVE
Protein Ur, POC: NEGATIVE mg/dL
Spec Grav, UA: >=1.03 — AB (ref 1.010–1.025)
Urobilinogen, UA: 0.2 E.U./dL
pH, UA: 6.5 (ref 5.0–8.0)

## 2020-03-13 LAB — POCT URINE PREGNANCY: Preg Test, Ur: NEGATIVE

## 2020-03-13 MED ORDER — MELOXICAM 15 MG PO TABS: 15.0000 mg | ORAL_TABLET | Freq: Every day | ORAL | 0 refills | Status: AC

## 2020-03-13 MED ORDER — CEPHALEXIN 500 MG PO CAPS: 500.0000 mg | ORAL_CAPSULE | Freq: Four times a day (QID) | ORAL | 0 refills | Status: DC

## 2020-03-13 NOTE — Discharge Instructions (Addendum)
Unable to rule out obstructed kidney stone in urgent care setting.  Offered patient further evaluation and management in the ED.  Patient declines at this time and would like to try outpatient therapy first.  Aware of the risk associated with this decision including missed diagnosis, organ damage, organ failure, and/or death.  Patient aware and in agreement.     Urine concerning for kidney stone, but will cover for infection as well Drink plenty of fluids and get rest Mobic prescribed for pain Keflex prescribed for infection Follow up with PCP if symptoms persist Go to ER if you have any new or worsening symptoms (difficulty urinating, blood in urine, pain that does not moderate with medication, fever, chills, abdominal pain, etc...)

## 2020-03-13 NOTE — ED Triage Notes (Signed)
Had covid in january

## 2020-03-13 NOTE — ED Triage Notes (Signed)
Fever last night, pain on right side for a few weeks.  Pain was worse last night.  Pain radiates up to right shoulder and lower abd.

## 2020-03-13 NOTE — ED Provider Notes (Signed)
[facility]   [id number] 03/13/20 Arrival Time: 0946  CC: ABDOMINAL DISCOMFORT  SUBJECTIVE:  Ozella Comins is a 30 y.o. female who presents with complaint of abdominal discomfort that began gradually a couple of weeks ago.  Denies a precipitating event, trauma, or injury.  Localizes pain to RUQ that radiates into RT flank.  Describes as intermittent, more persistent, and achy in character.  Has tried OTC medications with minimal relief.  Denies alleviating or aggravating factors.  Denies similar symptoms in the past. Reports fever of 102 last night and just not feeling well.   Denies nausea, vomiting, chest pain, SOB, diarrhea, constipation, hematochezia, melena, dysuria, difficulty urinating, increased frequency or urgency, flank pain, loss of bowel or bladder function.  Patient's last menstrual period was 02/18/2020.  ROS: As per HPI.  All other pertinent ROS negative.     Past Medical History:  Diagnosis Date  . Heart murmur    benign   Past Surgical History:  Procedure Laterality Date  . CHOLECYSTECTOMY N/A 10/12/2013   Procedure: LAPAROSCOPIC CHOLECYSTECTOMY WITH INTRAOPERATIVE CHOLANGIOGRAM;  Surgeon: Chevis Pretty III, MD;  Location: [facility] OR;  Service: General;  Laterality: N/A;  . WISDOM TOOTH EXTRACTION  2009   No Known Allergies No current facility-administered medications on file prior to encounter.   Current Outpatient Medications on File Prior to Encounter  Medication Sig Dispense Refill  . [DISCONTINUED] isometheptene-acetaminophen-dichloralphenazone (MIDRIN) 65-100-325 MG capsule Take 1 capsule by mouth 4 (four) times daily as needed for migraine. Maximum 5 capsules in 12 hours for migraine headaches, 8 capsules in 24 hours for tension headaches. 30 capsule 0  . [DISCONTINUED] mometasone (NASONEX) 50 MCG/ACT nasal spray Place 1 spray into the nose daily as needed.     Social History   Socioeconomic History  . Marital status: Married    Spouse name: Not on  file  . Number of children: Not on file  . Years of education: Not on file  . Highest education level: Not on file  Occupational History  . Not on file  Tobacco Use  . Smoking status: Never Smoker  . Smokeless tobacco: Never Used  Vaping Use  . Vaping Use: Never used  Substance and Sexual Activity  . Alcohol use: Yes    Comment: occasional in the past  . Drug use: No  . Sexual activity: Yes  Other Topics Concern  . Not on file  Social History Narrative  . Not on file   Social Determinants of Health   Financial Resource Strain: Not on file  Food Insecurity: Not on file  Transportation Needs: Not on file  Physical Activity: Not on file  Stress: Not on file  Social Connections: Not on file  Intimate Partner Violence: Not on file   Family History  Problem Relation Age of Onset  . Coronary artery disease Other   . Diabetes Maternal Aunt   . Heart disease Maternal Grandmother   . Diabetes Maternal Grandfather      OBJECTIVE:  Vitals:   03/13/20 1024  BP: 133/85  Pulse: (!) 105  Resp: 18  Temp: 98.9 F (37.2 C)  TempSrc: Oral  SpO2: 99%    General appearance: Alert; NAD HEENT: NCAT.  Oropharynx clear.  Lungs: clear to auscultation bilaterally without adventitious breath sounds Heart: regular rate and rhythm.   Abdomen: soft, non-distended; normal active bowel sounds; TTP over RUQ; nontender at McBurney's point; no guarding Back: TTP over RT flank Extremities: no edema; symmetrical with no gross deformities Skin: warm  and dry Neurologic: normal gait Psychological: alert and cooperative; normal mood and affect  LABS: Results for orders placed or performed during the hospital encounter of 03/13/20 (from the past 24 hour(s))  POCT urinalysis dipstick     Status: Abnormal   Collection Time: 03/13/20 11:22 AM  Result Value Ref Range   Color, UA yellow yellow   Clarity, UA clear clear   Glucose, UA negative negative mg/dL   Bilirubin, UA negative negative    Ketones, POC UA negative negative mg/dL   Spec Grav, UA >=2.025 (A) 1.010 - 1.025   Blood, UA moderate (A) negative   pH, UA 6.5 5.0 - 8.0   Protein Ur, POC negative negative mg/dL   Urobilinogen, UA 0.2 0.2 or 1.0 E.U./dL   Nitrite, UA Negative Negative   Leukocytes, UA Negative Negative  POCT urine pregnancy     Status: None   Collection Time: 03/13/20 11:22 AM  Result Value Ref Range   Preg Test, Ur Negative Negative   ASSESSMENT & PLAN:  1. RUQ pain   2. Right flank pain     Meds ordered this encounter  Medications  . meloxicam (MOBIC) 15 MG tablet    Sig: Take 1 tablet (15 mg total) by mouth daily.    Dispense:  20 tablet    Refill:  0    Order Specific Question:   Supervising Provider    Answer:   Eustace Moore [[id number]]  . cephALEXin (KEFLEX) 500 MG capsule    Sig: Take 1 capsule (500 mg total) by mouth 4 (four) times daily.    Dispense:  20 capsule    Refill:  0    Order Specific Question:   Supervising Provider    Answer:   Eustace Moore [[id number]]   Unable to rule out obstructed kidney stone in urgent care setting.  Offered patient further evaluation and management in the ED.  Patient declines at this time and would like to try outpatient therapy first.  Aware of the risk associated with this decision including missed diagnosis, organ damage, organ failure, and/or death.  Patient aware and in agreement.     Urine concerning for kidney stone, but will cover for infection as well Drink plenty of fluids and get rest Mobic prescribed for pain Keflex prescribed for infection Follow up with PCP if symptoms persist Go to ER if you have any new or worsening symptoms (difficulty urinating, blood in urine, pain that does not moderate with medication, fever, chills, abdominal pain, etc...)   Reviewed expectations re: course of current medical issues. Questions answered. Outlined signs and symptoms indicating need for more acute intervention. Patient verbalized  understanding. After Visit Summary given.   Rennis Harding, PA-C 03/13/20 1128

## 2020-03-15 LAB — URINE CULTURE

## 2020-04-03 ENCOUNTER — Other Ambulatory Visit: Payer: Self-pay

## 2020-04-03 ENCOUNTER — Encounter (HOSPITAL_COMMUNITY): Payer: Self-pay

## 2020-04-03 ENCOUNTER — Emergency Department (HOSPITAL_COMMUNITY)
Admission: EM | Admit: 2020-04-03 | Discharge: 2020-04-03 | Disposition: A | Discharge status: Home / Self Care | Payer: 59 | Attending: Emergency Medicine | Admitting: Emergency Medicine

## 2020-04-03 ENCOUNTER — Emergency Department (HOSPITAL_COMMUNITY): Payer: 59

## 2020-04-03 DIAGNOSIS — R1011 Right upper quadrant pain: Secondary | ICD-10-CM

## 2020-04-03 DIAGNOSIS — R42 Dizziness and giddiness: Secondary | ICD-10-CM | POA: Insufficient documentation

## 2020-04-03 DIAGNOSIS — R197 Diarrhea, unspecified: Secondary | ICD-10-CM | POA: Diagnosis not present

## 2020-04-03 DIAGNOSIS — R002 Palpitations: Secondary | ICD-10-CM | POA: Diagnosis not present

## 2020-04-03 DIAGNOSIS — E876 Hypokalemia: Secondary | ICD-10-CM | POA: Diagnosis not present

## 2020-04-03 DIAGNOSIS — N200 Calculus of kidney: Secondary | ICD-10-CM | POA: Insufficient documentation

## 2020-04-03 LAB — URINALYSIS, ROUTINE W REFLEX MICROSCOPIC
Bilirubin Urine: NEGATIVE
Glucose, UA: NEGATIVE mg/dL
Ketones, ur: NEGATIVE mg/dL
Leukocytes,Ua: NEGATIVE
Nitrite: NEGATIVE
Protein, ur: NEGATIVE mg/dL
Specific Gravity, Urine: 1.005 (ref 1.005–1.030)
pH: 7 (ref 5.0–8.0)

## 2020-04-03 LAB — POC URINE PREG, ED: Preg Test, Ur: NEGATIVE

## 2020-04-03 LAB — CBC WITH DIFFERENTIAL/PLATELET
Abs Immature Granulocytes: 0.03 10*3/uL (ref 0.00–0.07)
Basophils Absolute: 0 10*3/uL (ref 0.0–0.1)
Basophils Relative: 0 %
Eosinophils Absolute: 0 10*3/uL (ref 0.0–0.5)
Eosinophils Relative: 0 %
HCT: 41.4 % (ref 36.0–46.0)
Hemoglobin: 14.1 g/dL (ref 12.0–15.0)
Immature Granulocytes: 0 %
Lymphocytes Relative: 9 %
Lymphs Abs: 0.9 10*3/uL (ref 0.7–4.0)
MCH: 32.7 pg (ref 26.0–34.0)
MCHC: 34.1 g/dL (ref 30.0–36.0)
MCV: 96.1 fL (ref 80.0–100.0)
Monocytes Absolute: 0.6 10*3/uL (ref 0.1–1.0)
Monocytes Relative: 6 %
Neutro Abs: 7.9 10*3/uL — ABNORMAL HIGH (ref 1.7–7.7)
Neutrophils Relative %: 85 %
Platelets: 241 10*3/uL (ref 150–400)
RBC: 4.31 MIL/uL (ref 3.87–5.11)
RDW: 12 % (ref 11.5–15.5)
WBC: 9.4 10*3/uL (ref 4.0–10.5)
nRBC: 0 % (ref 0.0–0.2)

## 2020-04-03 LAB — COMPREHENSIVE METABOLIC PANEL
ALT: 10 U/L (ref 0–44)
AST: 15 U/L (ref 15–41)
Albumin: 4.4 g/dL (ref 3.5–5.0)
Alkaline Phosphatase: 39 U/L (ref 38–126)
Anion gap: 10 (ref 5–15)
BUN: 7 mg/dL (ref 6–20)
CO2: 25 mmol/L (ref 22–32)
Calcium: 9 mg/dL (ref 8.9–10.3)
Chloride: 105 mmol/L (ref 98–111)
Creatinine, Ser: 0.67 mg/dL (ref 0.44–1.00)
GFR, Estimated: >60 mL/min (ref >60)
Glucose, Bld: 100 mg/dL — ABNORMAL HIGH (ref 70–99)
Potassium: 3.2 mmol/L — ABNORMAL LOW (ref 3.5–5.1)
Sodium: 140 mmol/L (ref 135–145)
Total Bilirubin: 1 mg/dL (ref 0.3–1.2)
Total Protein: 7 g/dL (ref 6.5–8.1)

## 2020-04-03 LAB — LIPASE, BLOOD: Lipase: 31 U/L (ref 11–51)

## 2020-04-03 MED ORDER — POTASSIUM CHLORIDE CRYS ER 20 MEQ PO TBCR
20.0000 meq | EXTENDED_RELEASE_TABLET | Freq: Once | ORAL | Status: AC
  Administered 2020-04-03: 20 meq via ORAL
  Filled 2020-04-03: qty 1

## 2020-04-03 MED ORDER — HYOSCYAMINE SULFATE 0.125 MG PO TBDP: 0.1250 mg | ORAL_TABLET | ORAL | 0 refills | Status: AC | PRN

## 2020-04-03 MED ORDER — POTASSIUM CHLORIDE ER 10 MEQ PO TBCR: 10.0000 meq | EXTENDED_RELEASE_TABLET | Freq: Two times a day (BID) | ORAL | 0 refills | Status: AC

## 2020-04-03 NOTE — ED Triage Notes (Signed)
Pt presents to ED with complaints of upper right quadrant abdominal pain x 1 month. Pt states she has been treated a couple times for UTI with this pain. Pt states last night she had the pain and started getting dizzy and felt like her heart was racing. Also started having pain in left lower quadrant last night.

## 2020-04-03 NOTE — ED Notes (Signed)
ED Provider at bedside. 

## 2020-04-03 NOTE — ED Provider Notes (Signed)
[facility] EMERGENCY DEPARTMENT Provider Note   CSN: [id number] Arrival date & time: 04/03/20  1308     History Chief Complaint  Patient presents with  . Abdominal Pain    Sophia Reynolds is a 30 y.o. female with history of anxiety, surgical history significant for cholecystectomy in 2016, presenting for evaluation of 1 month history of episodic right upper quadrant pain described as aching.  She initially was seen at our urgent care center the first of this month at which time she had complaint of right upper quadrant pain that radiated into the right flank and had moderate hemoglobin in her urinalysis. She declined CT imaging at that time, denies history of kidney stones. She also endorsed a fever at that time so was covered with Keflex.  She states she had a repeat urinalysis after this treatment in her urine was negative, but continues to have intermittent aching in the right upper quadrant.  She has found no consistent triggers, but states food sometimes will trigger these episodes.  At times her pain radiates into the left upper quadrant region.  She denies shortness of breath, fevers or chills, nausea or vomiting.  She has since been seen by her PCP who sent her for an ultrasound which she had 4 days ago, results are currently pending.  Last night she additionally had chest palpitations and dizziness which lasted most of the night and her abdominal pain radiated also into the right lower quadrant.  These symptoms are currently better. She had 2 episodes of diarrhea yesterday, none today, denies dysuria, constipation, vaginal complaints.  She is currently on her menses.  The history is provided by the patient.       Past Medical History:  Diagnosis Date  . Heart murmur    benign    Patient Active Problem List   Diagnosis Date Noted  . [redacted] weeks gestation of pregnancy 10/29/2016  . Indication for care in labor or delivery 08/27/2013  . Normal vaginal delivery 08/27/2013    Past  Surgical History:  Procedure Laterality Date  . CHOLECYSTECTOMY N/A 10/12/2013   Procedure: LAPAROSCOPIC CHOLECYSTECTOMY WITH INTRAOPERATIVE CHOLANGIOGRAM;  Surgeon: Chevis Pretty III, MD;  Location: [facility] OR;  Service: General;  Laterality: N/A;  . WISDOM TOOTH EXTRACTION  2009     OB History    Gravida  2   Para  2   Term  2   Preterm      AB      Living  2     SAB      IAB      Ectopic      Multiple  0   Live Births  2           Family History  Problem Relation Age of Onset  . Coronary artery disease Other   . Diabetes Maternal Aunt   . Heart disease Maternal Grandmother   . Diabetes Maternal Grandfather     Social History   Tobacco Use  . Smoking status: Never Smoker  . Smokeless tobacco: Never Used  Vaping Use  . Vaping Use: Never used  Substance Use Topics  . Alcohol use: Yes    Comment: occasional in the past  . Drug use: No    Home Medications Prior to Admission medications   Medication Sig Start Date End Date Taking? Authorizing Provider  hyoscyamine (ANASPAZ) 0.125 MG TBDP disintergrating tablet Place 1 tablet (0.125 mg total) under the tongue every 4 (four) hours as needed for cramping.  04/03/20  Yes Idol, Raynelle Fanning, PA-C  potassium chloride (KLOR-CON) 10 MEQ tablet Take 1 tablet (10 mEq total) by mouth 2 (two) times daily. 04/03/20  Yes Idol, Raynelle Fanning, PA-C  sertraline (ZOLOFT) 50 MG tablet Take 50 mg by mouth daily. 03/21/20  Yes [provider]  cephALEXin (KEFLEX) 500 MG capsule Take 1 capsule (500 mg total) by mouth 4 (four) times daily. Patient not taking: No sig reported 03/13/20   Wurst, [place], PA-C  meloxicam (MOBIC) 15 MG tablet Take 1 tablet (15 mg total) by mouth daily. Patient not taking: Reported on 04/03/2020 03/13/20   Rennis Harding, PA-C  isometheptene-acetaminophen-dichloralphenazone (MIDRIN) 201 700 3317 MG capsule Take 1 capsule by mouth 4 (four) times daily as needed for migraine. Maximum 5 capsules in 12 hours for migraine  headaches, 8 capsules in 24 hours for tension headaches. 01/17/18 03/13/20  Burgess Amor, PA-C  mometasone (NASONEX) 50 MCG/ACT nasal spray Place 1 spray into the nose daily as needed.  03/13/20  [provider]    Allergies    Patient has no known allergies.  Review of Systems   Review of Systems  Constitutional: Negative for chills and fever.  HENT: Negative for congestion.   Eyes: Negative.   Respiratory: Negative for chest tightness and shortness of breath.   Cardiovascular: Positive for palpitations. Negative for chest pain.  Gastrointestinal: Positive for abdominal pain and diarrhea. Negative for constipation, nausea and vomiting.  Genitourinary: Negative for dysuria and hematuria.  Musculoskeletal: Negative for arthralgias, joint swelling and neck pain.  Skin: Negative.  Negative for rash and wound.  Neurological: Positive for dizziness. Negative for weakness, light-headedness, numbness and headaches.  Psychiatric/Behavioral: Negative.   All other systems reviewed and are negative.   Physical Exam Updated Vital Signs BP 109/73   Pulse 66   Temp 98.6 F (37 C) (Oral)   Resp 12   Ht 5\' 5"  (1.651 m)   Wt 61.7 kg   LMP 03/30/2020   SpO2 100%   BMI 22.63 kg/m   Physical Exam Vitals and nursing note reviewed.  Constitutional:      Appearance: She is well-developed.  HENT:     Head: Normocephalic and atraumatic.  Eyes:     Conjunctiva/sclera: Conjunctivae normal.  Cardiovascular:     Rate and Rhythm: Normal rate and regular rhythm.     Heart sounds: Normal heart sounds. No murmur heard.   Pulmonary:     Effort: Pulmonary effort is normal.     Breath sounds: Normal breath sounds. No wheezing.  Abdominal:     General: Abdomen is flat. Bowel sounds are normal. There is no distension.     Palpations: Abdomen is soft.     Tenderness: There is abdominal tenderness in the right upper quadrant, epigastric area and left upper quadrant. There is no right CVA  tenderness, guarding or rebound.     Comments: No distention or increased tympany, no guarding.  Non acute abdominal findings.  Musculoskeletal:        General: Normal range of motion.     Cervical back: Normal range of motion.  Skin:    General: Skin is warm and dry.  Neurological:     Mental Status: She is alert.     ED Results / Procedures / Treatments   Labs (all labs ordered are listed, but only abnormal results are displayed) Labs Reviewed  COMPREHENSIVE METABOLIC PANEL - Abnormal; Notable for the following components:      Result Value   Potassium 3.2 (*)  Glucose, Bld 100 (*)    All other components within normal limits  CBC WITH DIFFERENTIAL/PLATELET - Abnormal; Notable for the following components:   Neutro Abs 7.9 (*)    All other components within normal limits  URINALYSIS, ROUTINE W REFLEX MICROSCOPIC - Abnormal; Notable for the following components:   Color, Urine STRAW (*)    Hgb urine dipstick MODERATE (*)    Bacteria, UA RARE (*)    All other components within normal limits  LIPASE, BLOOD  POC URINE PREG, ED    EKG EKG Interpretation  Date/Time:  Tuesday April 03 2020 09:16:27 EDT Ventricular Rate:  91 PR Interval:    QRS Duration: 83 QT Interval:  372 QTC Calculation: 458 R Axis:   71 Text Interpretation: Sinus rhythm Right atrial enlargement Borderline repolarization abnormality Confirmed by Pricilla LovelessGoldston, Scott [phone]([id number]) on 04/03/2020 9:31:19 AM   Radiology CT Renal Stone Study  Result Date: 04/03/2020 CLINICAL DATA:  Upper abdominal pain for 1 month, no hematuria, no history of kidney stones EXAM: CT ABDOMEN AND PELVIS WITHOUT CONTRAST TECHNIQUE: Multidetector CT imaging of the abdomen and pelvis was performed following the standard protocol without IV contrast. COMPARISON:  None. FINDINGS: Lower chest: No acute abnormality. Hepatobiliary: No focal liver abnormality is seen. Status post cholecystectomy. No biliary dilatation. Pancreas: Unremarkable. No  pancreatic ductal dilatation or surrounding inflammatory changes. Spleen: Mild splenomegaly, maximum coronal span 14.0 cm. Adrenals/Urinary Tract: Adrenal glands are unremarkable. Punctuate nonobstructive calculi of the inferior poles of the bilateral kidneys (series 5, image 50, series 2, image 34). No ureteral calculi or hydronephrosis. Bladder is unremarkable. Stomach/Bowel: Stomach is within normal limits. Appendix appears normal. No evidence of bowel wall thickening, distention, or inflammatory changes. Vascular/Lymphatic: No significant vascular findings are present. No enlarged abdominal or pelvic lymph nodes. Reproductive: No mass or other significant abnormality. Other: No abdominal wall hernia or abnormality. No abdominopelvic ascites. Musculoskeletal: No acute or significant osseous findings. IMPRESSION: 1. Punctuate nonobstructive calculi of the inferior poles of the bilateral kidneys. No ureteral calculi or hydronephrosis. 2. Mild splenomegaly, maximum coronal span 14.0 cm, nonspecific. 3. Status post cholecystectomy. Electronically Signed   By: Lauralyn PrimesAlex  Bibbey M.D.   On: 04/03/2020 12:02    Procedures Procedures   Medications Ordered in ED Medications  potassium chloride SA (KLOR-CON) CR tablet 20 mEq (20 mEq Oral Given 04/03/20 1317)    ED Course  I have reviewed the triage vital signs and the nursing notes.  Pertinent labs & imaging results that were available during my care of the patient were reviewed by me and considered in my medical decision making (see chart for details).    MDM Rules/Calculators/A&P                          Labs and imaging reviewed and discussed with pt. And mother who is now at bedside.  Labs reassuring, mild hypokalemia with replacement given .  No uti, but there is hgb, currently on menses and clean catch specimen.  CT sig for bilateral renal but not ureteral stones.  It is possible she actually did pass [facility]  [facility], however, no active passage at present. Discussed signs/sx to watch for with this event.    Pt is currently pending US results and f/u with her pcp, also given new info that she was prescribed hyoscyamine last Wednesday which seems to help her sx but she forgets to  take.  This is encouraging, suggesting possible IBS, spasm as source of sx.  She has requested referral to GI which was given. \ The patient appears reasonably screened and/or stabilized for discharge and I doubt any other medical condition or other [facility] requiring further screening, evaluation, or treatment in the ED at this time prior to discharge.  Final Clinical Impression(s) / ED Diagnoses Final diagnoses:  Right upper quadrant abdominal pain  Hypokalemia  Bilateral renal stones    Rx / DC Orders ED Discharge Orders         Ordered    hyoscyamine (ANASPAZ) 0.125 MG TBDP disintergrating tablet  Every 4 hours PRN        04/03/20 1243    potassium chloride (KLOR-CON) 10 MEQ tablet  2 times daily        04/03/20 1243           Burgess Amor, Cordelia Poche 04/03/20 1555    Pricilla Loveless, MD 04/06/20 [phone]

## 2020-04-03 NOTE — Discharge Instructions (Addendum)
Your work up today is reassuring. It is possible your symptoms are from intestinal spasm,  especially since it improves when you take the hyoscyamine.    Your potassium level is low today, however,  and is being replaced  - usually, a low potassium will cause constipation, not diarrhea.  Call the GI specialist listed for further evaluation of your pain.

## 2020-06-05 IMAGING — MR MR HEAD WO/W CM
13 series · 48 of 48 positions shown · IV contrast (multihance)
Comparison: Head CT 01/17/2018

CLINICAL DATA: Right arm and occasional leg numbness over the last
month. Assess for possible demyelinating disease.

EXAM:
MRI HEAD WITHOUT AND WITH CONTRAST
TECHNIQUE: Multiplanar, multiecho pulse sequences of the brain and surrounding
structures were obtained without and with intravenous contrast.
CONTRAST:  10mL MULTIHANCE GADOBENATE DIMEGLUMINE 529 MG/ML IV SOLN

[Series 5: T1 · sagittal · 4.0mm · 0.75mm/px · 1 of 31 slices shown (1 of 3)]
[im 1/31]
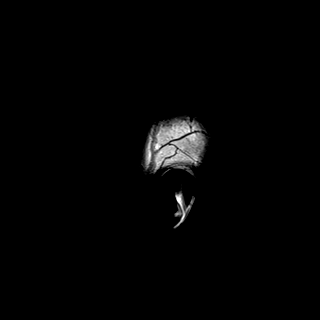

[Series 6: FLAIR · sagittal · 4.0mm · 0.72mm/px · 1 of 27 slices shown (1 of 2)]
[im 1/27]
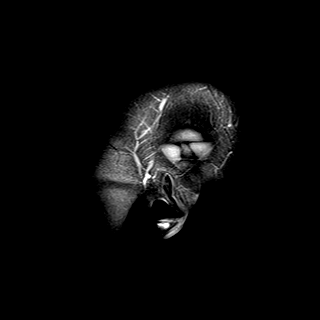

[Series 7: DWI · axial · 3.0mm · 1.44mm/px · z∈[-13,+123]mm · 5 of 84 slices shown (1 of 4)]
[im 1/84]
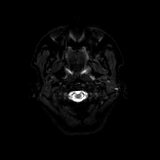
[im 21/84]
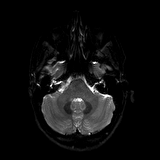
[im 42/84]
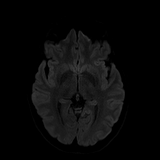
[im 63/84]
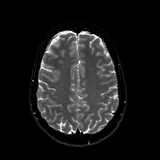
[im 84/84]
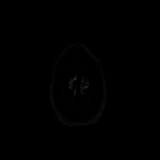

[Series 8: DWI · axial · 3.0mm · 1.44mm/px · z∈[-13,+123]mm · 3 of 41 slices shown (2 of 4)]
[im 1/41]
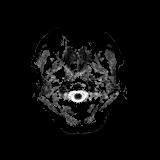
[im 21/41]
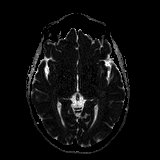
[im 41/41]
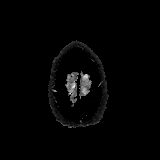

[Series 9: DWI · coronal · 5.0mm · 1.44mm/px · 4 of 60 slices shown (3 of 4)]
[im 1/60]
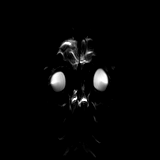
[im 20/60]
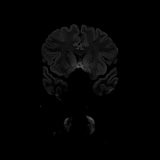
[im 40/60]
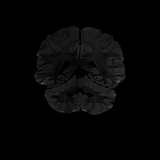
[im 60/60]
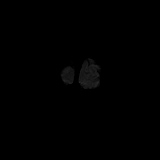

[Series 10: DWI · coronal · 5.0mm · 1.44mm/px · 2 of 30 slices shown (4 of 4)]
[im 1/30]
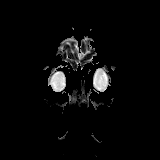
[im 30/30]
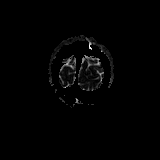

[Series 11: T2 · axial · 4.0mm · 0.36mm/px · z∈[-13,+127]mm · 2 of 28 slices shown]
[im 1/28]
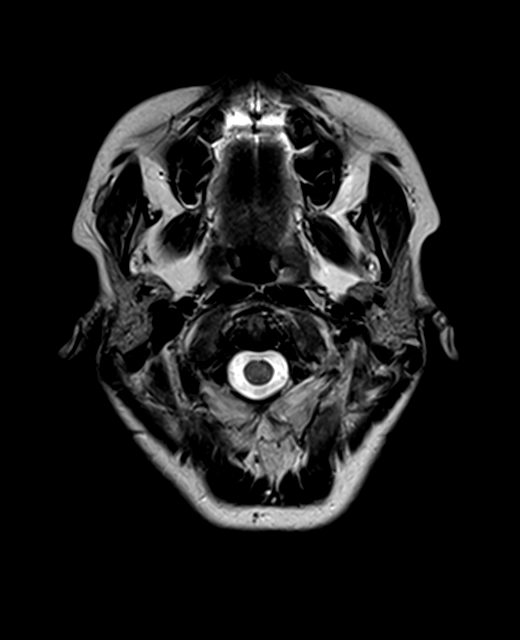
[im 28/28]
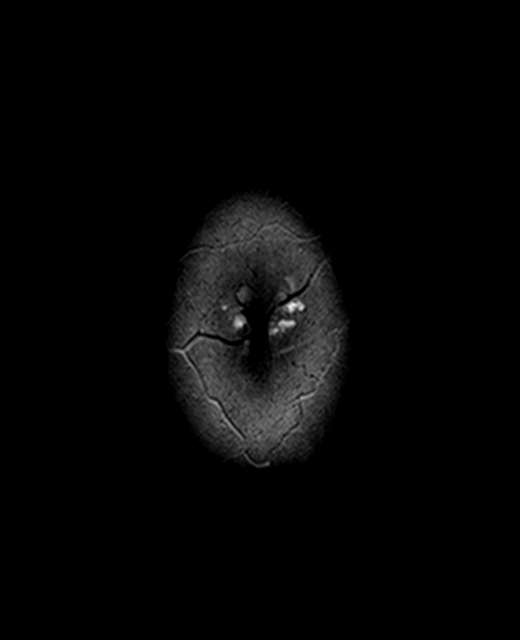

[Series 12: FLAIR · axial · 3.0mm · 0.72mm/px · z∈[-18,+132]mm · 2 of 26 slices shown (2 of 2)]
[im 1/26]
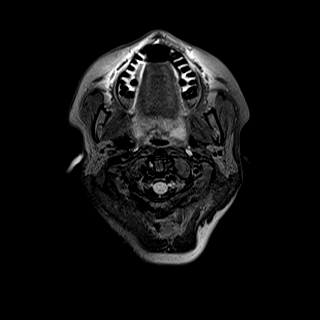
[im 26/26]
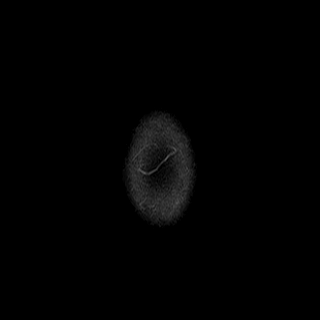

[Series 14: swi_images · axial · 1.5mm · 0.90mm/px · z∈[-14,+128]mm · 6 of 96 slices shown]
[im 1/96]
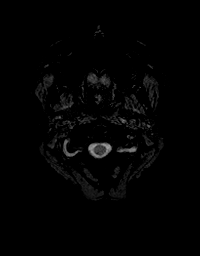
[im 20/96]
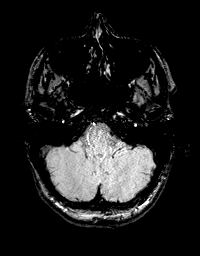
[im 39/96]
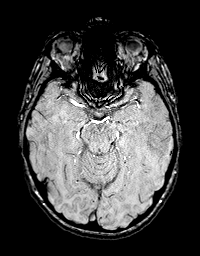
[im 58/96]
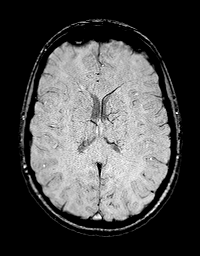
[im 77/96]
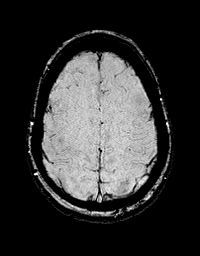
[im 96/96]
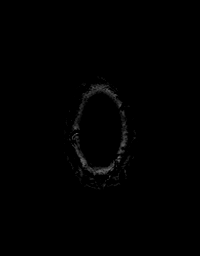

[Series 15: T1 · axial · 1.0mm · 0.90mm/px · z∈[-10,+133]mm · 9 of 144 slices shown (2 of 3)]
[im 1/144]
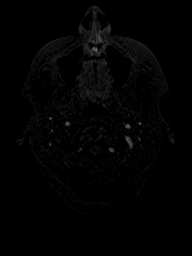
[im 18/144]
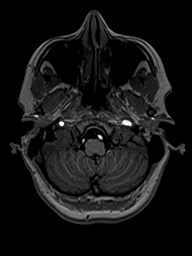
[im 36/144]
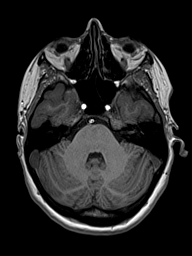
[im 54/144]
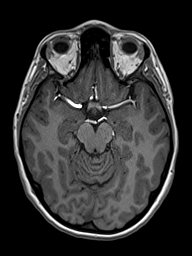
[im 72/144]
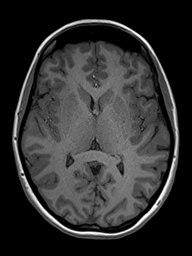
[im 90/144]
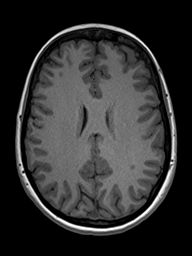
[im 108/144]
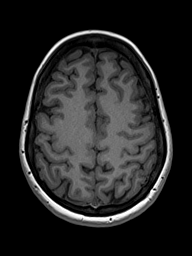
[im 126/144]
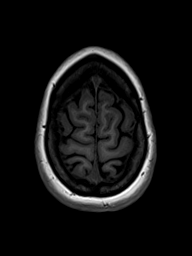
[im 144/144]
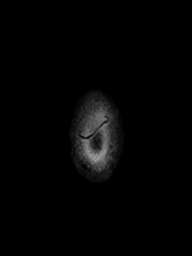

[Series 16: T2 post-contrast · coronal · 4.0mm · 0.36mm/px · 2 of 35 slices shown]
[im 1/35]
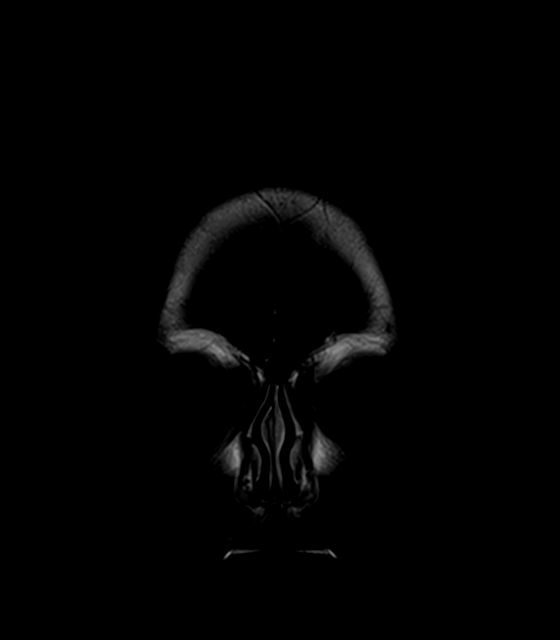
[im 35/35]
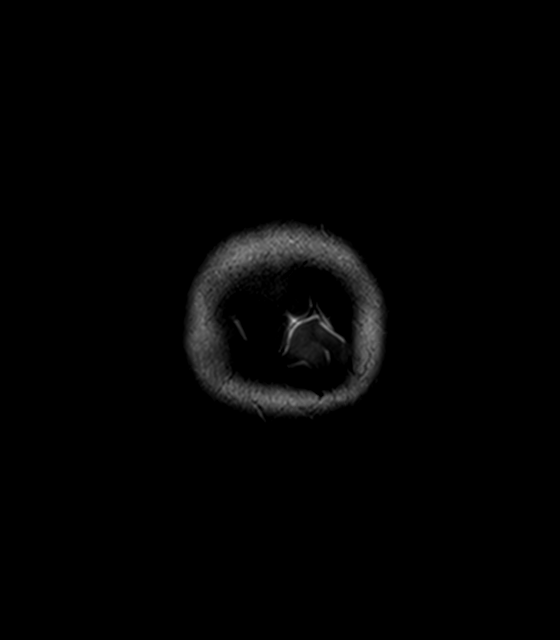

[Series 17: T1 · axial · 1.0mm · 0.90mm/px · z∈[-10,+133]mm · 9 of 144 slices shown (3 of 3)]
[im 1/144]
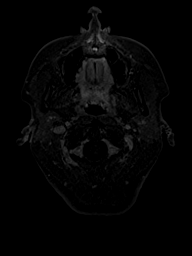
[im 18/144]
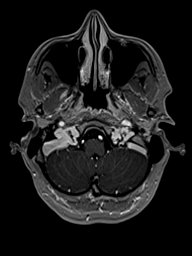
[im 36/144]
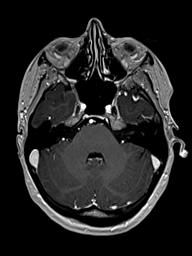
[im 54/144]
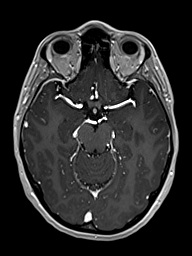
[im 72/144]
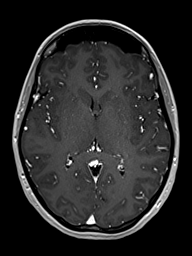
[im 90/144]
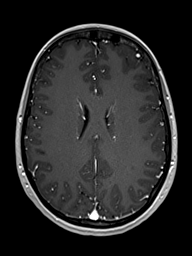
[im 108/144]
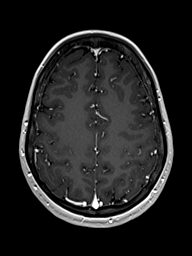
[im 126/144]
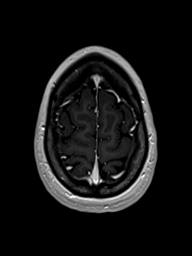
[im 144/144]
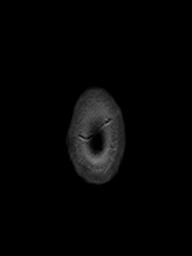

[Series 18: T1 post-contrast · coronal · 4.0mm · 0.72mm/px · 2 of 35 slices shown]
[im 1/35]
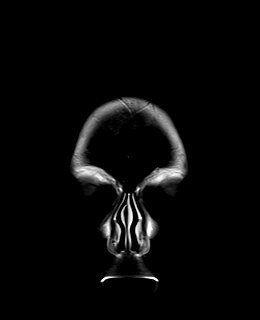
[im 35/35]
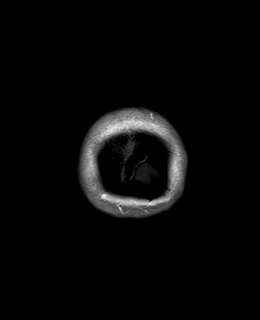

[48 of 48 positions shown; findings below may reference images not displayed]

FINDINGS: Brain: Diffusion imaging does not show any acute or subacute
infarction. The brainstem and cerebellum are normal. Cerebral
hemispheres are normal with the exception of a very few punctate
foci of T2 and FLAIR signal in the frontal white matter, commonly
seen and not felt to be significant. There are no foci of white
matter signal typical for or suggestive of multiple sclerosis. No
mass, hemorrhage, hydrocephalus or extra-axial collection. After
contrast administration, no abnormal enhancement occurs.

Vascular: Major vessels at the base of the brain show flow.

Skull and upper cervical spine: Negative

Sinuses/Orbits: Clear/normal

Other: None
IMPRESSION: No abnormality seen to explain the presenting symptoms. No finding
suggestive of multiple sclerosis. Few punctate foci of T2 and FLAIR
signal in the frontal white matter, often seen in normal individuals
and not likely significant.

## 2020-06-10 ENCOUNTER — Telehealth: Payer: 59 | Admitting: Nurse Practitioner

## 2020-06-10 DIAGNOSIS — J329 Chronic sinusitis, unspecified: Secondary | ICD-10-CM | POA: Diagnosis not present

## 2020-06-10 MED ORDER — AMOXICILLIN-POT CLAVULANATE 875-125 MG PO TABS: 1.0000 | ORAL_TABLET | Freq: Two times a day (BID) | ORAL | 0 refills | Status: AC

## 2020-06-10 MED ORDER — FLUTICASONE PROPIONATE 50 MCG/ACT NA SUSP: 2.0000 | Freq: Every day | NASAL | 0 refills | Status: DC

## 2020-06-10 NOTE — Progress Notes (Signed)
We are sorry that you are not feeling well.  Here is how we plan to help!  Based on what you have shared with me it looks like you have sinusitis.  Sinusitis is inflammation and infection in the sinus cavities of the head.  Based on your presentation I believe you most likely have Acute Bacterial Sinusitis.  This is an infection caused by bacteria and is treated with antibiotics. I have prescribed Augmentin 875mg/125mg one tablet twice daily with food, for 7 days. I am also prescribing Flonase nasal spray, spray 2 sprays in each nostril daily until symptoms improve.  You may use an oral decongestant such as Mucinex D or if you have glaucoma or high blood pressure use plain Mucinex. Saline nasal spray help and can safely be used as often as needed for congestion.  If you develop worsening sinus pain, fever or notice severe headache and vision changes, or if symptoms are not better after completion of antibiotic, please schedule an appointment with a health care provider.    Sinus infections are not as easily transmitted as other respiratory infection, however we still recommend that you avoid close contact with loved ones, especially the very young and elderly.  Remember to wash your hands thoroughly throughout the day as this is the number one way to prevent the spread of infection!  Home Care:  Only take medications as instructed by your medical team.  Complete the entire course of an antibiotic.  Do not take these medications with alcohol.  A steam or ultrasonic humidifier can help congestion.  You can place a towel over your head and breathe in the steam from hot water coming from a faucet.  Avoid close contacts especially the very young and the elderly.  Cover your mouth when you cough or sneeze.  Always remember to wash your hands.  Get Help Right Away If:  You develop worsening fever or sinus pain.  You develop a severe head ache or visual changes.  Your symptoms persist after you  have completed your treatment plan.  Make sure you  Understand these instructions.  Will watch your condition.  Will get help right away if you are not doing well or get worse.  Your e-visit answers were reviewed by a board certified advanced clinical practitioner to complete your personal care plan.  Depending on the condition, your plan could have included both over the counter or prescription medications.  If there is a problem please reply  once you have received a response from your provider.  Your safety is important to us.  If you have drug allergies check your prescription carefully.    You can use MyChart to ask questions about today's visit, request a non-urgent call back, or ask for a work or school excuse for 24 hours related to this e-Visit. If it has been greater than 24 hours you will need to follow up with your provider, or enter a new e-Visit to address those concerns.  You will get an e-mail in the next two days asking about your experience.  I hope that your e-visit has been valuable and will speed your recovery. Thank you for using e-visits.  I have spent at least 5 minutes reviewing and documenting in the patient's chart.   

## 2020-10-24 DIAGNOSIS — S0501XA Injury of conjunctiva and corneal abrasion without foreign body, right eye, initial encounter: Secondary | ICD-10-CM | POA: Diagnosis not present

## 2020-12-04 ENCOUNTER — Telehealth: Payer: 59 | Admitting: Physician Assistant

## 2020-12-04 DIAGNOSIS — J019 Acute sinusitis, unspecified: Secondary | ICD-10-CM

## 2020-12-04 NOTE — Progress Notes (Signed)
Unfortunately, through the digital health platform we are unable to prescribe medications outside of [place].  If you are going to be out of town more than 1-2 days more days, we recommend to either contact your PCP or to please seek care at a local urgent care.  We do apologize for this inconvenience.  Best Wishes,  Jenni Champ Keetch, PA-C  

## 2021-01-01 ENCOUNTER — Telehealth: Payer: 59 | Admitting: Physician Assistant

## 2021-01-01 DIAGNOSIS — J019 Acute sinusitis, unspecified: Secondary | ICD-10-CM | POA: Diagnosis not present

## 2021-01-01 DIAGNOSIS — B9689 Other specified bacterial agents as the cause of diseases classified elsewhere: Secondary | ICD-10-CM | POA: Diagnosis not present

## 2021-01-01 MED ORDER — AMOXICILLIN-POT CLAVULANATE 875-125 MG PO TABS: 1.0000 | ORAL_TABLET | Freq: Two times a day (BID) | ORAL | 0 refills | Status: AC

## 2021-01-01 NOTE — Progress Notes (Signed)
I have spent 5 minutes in review of e-visit questionnaire, review and updating patient chart, medical decision making and response to patient.   Jaymien Landin Cody Janeya Deyo, PA-C    

## 2021-01-01 NOTE — Progress Notes (Signed)

## 2021-03-15 DIAGNOSIS — F411 Generalized anxiety disorder: Secondary | ICD-10-CM | POA: Diagnosis not present

## 2021-10-02 DIAGNOSIS — R1084 Generalized abdominal pain: Secondary | ICD-10-CM | POA: Diagnosis not present

## 2022-02-25 DIAGNOSIS — J101 Influenza due to other identified influenza virus with other respiratory manifestations: Secondary | ICD-10-CM | POA: Diagnosis not present

## 2022-02-25 DIAGNOSIS — Z6823 Body mass index (BMI) 23.0-23.9, adult: Secondary | ICD-10-CM | POA: Diagnosis not present

## 2022-08-01 DIAGNOSIS — F411 Generalized anxiety disorder: Secondary | ICD-10-CM | POA: Diagnosis not present
